# Patient Record
Sex: Female | Born: 1965
Health system: Southern US, Community
[De-identification: ages and names within clinical notes are randomized; demographics above are authoritative.]

## PROBLEM LIST (undated history)

## (undated) DIAGNOSIS — R011 Cardiac murmur, unspecified: Secondary | ICD-10-CM

## (undated) DIAGNOSIS — K219 Gastro-esophageal reflux disease without esophagitis: Secondary | ICD-10-CM

## (undated) DIAGNOSIS — Z923 Personal history of irradiation: Secondary | ICD-10-CM

## (undated) DIAGNOSIS — N390 Urinary tract infection, site not specified: Secondary | ICD-10-CM

## (undated) DIAGNOSIS — F419 Anxiety disorder, unspecified: Secondary | ICD-10-CM

## (undated) DIAGNOSIS — T7840XA Allergy, unspecified, initial encounter: Secondary | ICD-10-CM

## (undated) HISTORY — DX: Gastro-esophageal reflux disease without esophagitis: K21.9

## (undated) HISTORY — PX: CHOLECYSTECTOMY: SHX55

## (undated) HISTORY — PX: DILATION AND CURETTAGE OF UTERUS: SHX78

## (undated) HISTORY — DX: Cardiac murmur, unspecified: R01.1

## (undated) HISTORY — PX: WISDOM TOOTH EXTRACTION: SHX21

## (undated) HISTORY — DX: Allergy, unspecified, initial encounter: T78.40XA

## (undated) HISTORY — DX: Urinary tract infection, site not specified: N39.0

---
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
## 1998-04-03 ENCOUNTER — Ambulatory Visit (HOSPITAL_COMMUNITY): Admission: RE | Admit: 1998-04-03 | Discharge: 1998-04-03 | Payer: Self-pay | Admitting: Obstetrics and Gynecology

## 1998-06-14 ENCOUNTER — Inpatient Hospital Stay (HOSPITAL_COMMUNITY): Admission: AD | Admit: 1998-06-14 | Discharge: 1998-06-14 | Payer: Self-pay | Admitting: Obstetrics and Gynecology

## 1998-06-16 ENCOUNTER — Encounter (HOSPITAL_COMMUNITY): Admission: RE | Admit: 1998-06-16 | Discharge: 1998-06-29 | Payer: Self-pay | Admitting: Obstetrics and Gynecology

## 1998-06-28 ENCOUNTER — Inpatient Hospital Stay (HOSPITAL_COMMUNITY): Admission: AD | Admit: 1998-06-28 | Discharge: 1998-07-01 | Payer: Self-pay | Admitting: Obstetrics and Gynecology

## 1999-12-19 ENCOUNTER — Other Ambulatory Visit: Admission: RE | Admit: 1999-12-19 | Discharge: 1999-12-19 | Payer: Self-pay | Admitting: Obstetrics and Gynecology

## 2001-01-22 ENCOUNTER — Ambulatory Visit (HOSPITAL_BASED_OUTPATIENT_CLINIC_OR_DEPARTMENT_OTHER): Admission: RE | Admit: 2001-01-22 | Discharge: 2001-01-22 | Payer: Self-pay | Admitting: Plastic Surgery

## 2001-01-22 ENCOUNTER — Encounter (INDEPENDENT_AMBULATORY_CARE_PROVIDER_SITE_OTHER): Payer: Self-pay | Admitting: Specialist

## 2001-02-09 ENCOUNTER — Other Ambulatory Visit: Admission: RE | Admit: 2001-02-09 | Discharge: 2001-02-09 | Payer: Self-pay | Admitting: Obstetrics and Gynecology

## 2002-02-10 ENCOUNTER — Other Ambulatory Visit: Admission: RE | Admit: 2002-02-10 | Discharge: 2002-02-10 | Payer: Self-pay | Admitting: Obstetrics and Gynecology

## 2003-06-27 ENCOUNTER — Encounter: Payer: Self-pay | Admitting: Obstetrics and Gynecology

## 2003-06-27 ENCOUNTER — Ambulatory Visit (HOSPITAL_COMMUNITY): Admission: RE | Admit: 2003-06-27 | Discharge: 2003-06-27 | Payer: Self-pay | Admitting: Obstetrics and Gynecology

## 2003-06-29 ENCOUNTER — Ambulatory Visit (HOSPITAL_COMMUNITY): Admission: RE | Admit: 2003-06-29 | Discharge: 2003-06-29 | Payer: Self-pay | Admitting: Obstetrics and Gynecology

## 2003-06-29 ENCOUNTER — Encounter (INDEPENDENT_AMBULATORY_CARE_PROVIDER_SITE_OTHER): Payer: Self-pay | Admitting: Specialist

## 2003-08-20 ENCOUNTER — Inpatient Hospital Stay (HOSPITAL_COMMUNITY): Admission: AD | Admit: 2003-08-20 | Discharge: 2003-08-20 | Payer: Self-pay | Admitting: Obstetrics and Gynecology

## 2004-01-02 ENCOUNTER — Encounter: Admission: RE | Admit: 2004-01-02 | Discharge: 2004-01-02 | Payer: Self-pay | Admitting: Obstetrics and Gynecology

## 2006-06-30 ENCOUNTER — Encounter: Admission: RE | Admit: 2006-06-30 | Discharge: 2006-06-30 | Payer: Self-pay | Admitting: Obstetrics and Gynecology

## 2007-07-13 ENCOUNTER — Encounter: Admission: RE | Admit: 2007-07-13 | Discharge: 2007-07-13 | Payer: Self-pay | Admitting: Surgery

## 2007-09-07 ENCOUNTER — Encounter: Admission: RE | Admit: 2007-09-07 | Discharge: 2007-09-07 | Payer: Self-pay | Admitting: Obstetrics and Gynecology

## 2008-06-02 ENCOUNTER — Ambulatory Visit: Payer: Self-pay | Admitting: Cardiovascular Disease

## 2008-07-01 ENCOUNTER — Encounter: Payer: Self-pay | Admitting: Cardiovascular Disease

## 2008-07-01 ENCOUNTER — Ambulatory Visit: Payer: Self-pay

## 2008-07-28 DIAGNOSIS — Z8679 Personal history of other diseases of the circulatory system: Secondary | ICD-10-CM

## 2008-07-28 DIAGNOSIS — K219 Gastro-esophageal reflux disease without esophagitis: Secondary | ICD-10-CM | POA: Insufficient documentation

## 2008-08-01 ENCOUNTER — Ambulatory Visit: Payer: Self-pay | Admitting: Gastroenterology

## 2008-08-01 DIAGNOSIS — K8020 Calculus of gallbladder without cholecystitis without obstruction: Secondary | ICD-10-CM | POA: Insufficient documentation

## 2008-09-26 ENCOUNTER — Encounter: Admission: RE | Admit: 2008-09-26 | Discharge: 2008-09-26 | Payer: Self-pay | Admitting: Obstetrics and Gynecology

## 2009-01-18 ENCOUNTER — Encounter (INDEPENDENT_AMBULATORY_CARE_PROVIDER_SITE_OTHER): Payer: Self-pay | Admitting: *Deleted

## 2009-10-16 ENCOUNTER — Encounter: Admission: RE | Admit: 2009-10-16 | Discharge: 2009-10-16 | Payer: Self-pay | Admitting: Obstetrics and Gynecology

## 2010-10-13 ENCOUNTER — Other Ambulatory Visit: Payer: Self-pay | Admitting: Family Medicine

## 2010-10-13 DIAGNOSIS — Z9289 Personal history of other medical treatment: Secondary | ICD-10-CM

## 2010-10-29 ENCOUNTER — Ambulatory Visit (HOSPITAL_COMMUNITY): Payer: Self-pay

## 2010-10-29 ENCOUNTER — Ambulatory Visit
Admission: RE | Admit: 2010-10-29 | Discharge: 2010-10-29 | Disposition: A | Discharge status: Home / Self Care | Payer: BC Managed Care – PPO | Source: Ambulatory Visit | Attending: Family Medicine | Admitting: Family Medicine

## 2010-10-29 DIAGNOSIS — Z9289 Personal history of other medical treatment: Secondary | ICD-10-CM

## 2010-11-01 ENCOUNTER — Other Ambulatory Visit: Payer: Self-pay | Admitting: Obstetrics and Gynecology

## 2010-11-01 DIAGNOSIS — Z9289 Personal history of other medical treatment: Secondary | ICD-10-CM

## 2011-02-05 NOTE — Assessment & Plan Note (Signed)
[facility]                            CARDIOLOGY OFFICE NOTE   NAME:[name], [name]                      MRN:          [id number]  DATE:06/02/2008                            DOB:          [birth year]    A 45 year old patient referred for some dyspnea and atypical chest pain.  The patient's coronary risk factors are limited.  She is nondiabetic,  nonsmoker, nonhypertensive, family history is negative.   She has been having pain for about a month.  The pain does have a GI-  type component.  She gets a pressure sensation in the middle of her  chest, sometimes it is relieved with drinking a coke.  She also gets  some relief with burping.  She has been on Nexium for about 2 weeks.  She says it helps some, but the pain is still there.  She does  occasionally get it with walking.  She is in 2 exercise classes and  indicates that she can get some left-sided pain during her exercise  classes.  There is some mild exertional dyspnea as well.  There is no  cough, PND, or orthopnea.  No volume overload.  No history of smoking or  COPD.  The pain is seems to be improving some, but it is persistent,  there is no radiation down the arms.   Review of systems otherwise negative.   Her past medical history is fairly benign.  She has had a previous  cholecystectomy.   The patient does not smoke or drink.  She is happily married.  She has  one 39-year-old son.  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   Family history is negative for premature coronary disease.  Mother and  father still alive.  She is taking Nexium, has no known allergies.   PHYSICAL EXAMINATION:  GENERAL:  Exam is remarkable for thin white  female in no distress.  VITAL SIGNS:  Blood pressure is 128/80, pulse 70 regular, respiratory  rate 14, afebrile.  HEENT:  Unremarkable.  NECK:  Carotids normal without bruit, no lymphadenopathy, thyromegaly,  or JVP elevation.  LUNGS:  Clear.   Good diaphragmatic motion.  No wheezing.  S1 and S2 with  normal heart sounds.  PMI normal.  ABDOMEN:  Benign.  Bowel sounds positive.  No AAA no tenderness, no  bruit, no hepatosplenomegaly, hepatojugular reflux, and tenderness.  EXTREMITIES:  Distal pulses were intact.  No edema.  NEURO:  Nonfocal.  SKIN:  Warm and dry.  No muscular weakness.   EKG shows sinus rhythm with nonspecific ST-T wave changes of an  incomplete right bundle branch block.   IMPRESSION:  1. Atypical chest pain, minor abnormalities on EKG.  Follow-up stress      echo.  2. Likely a reflux.  Continue Nexium.  Consider switching to Protonix      if symptoms continue would refer her to GI for Helicobacter pylori      testing.  3. Mild exertional dyspnea likely functional.  Continue to do exercise      classes, we will do a  baseline chest x-ray today given her chest      pain and mild dyspnea, I think it is important to just make sure      her cardiac silhouette is normal.  There is no mediastinal masses      and that her lung fields are clear.     Noralyn Pick. Eden Emms, MD, [facility]  Electronically Signed    PCN/MedQ  DD: 06/02/2008  DT: 06/03/2008  Job #: [id number]   cc:   Bennie Pierini, FNP

## 2011-02-08 NOTE — Op Note (Signed)
[facility]  Patient:    [name], [name]                      MRN: [id number] Proc. Date: 01/22/01 Adm. Date:  [id number] Attending:  Chapman Moss                           Operative Report  PREOPERATIVE DIAGNOSIS:  Basal cell carcinoma of the upper lip which touches the vermilion border and is at least 5 mm in diameter.  POSTOPERATIVE DIAGNOSIS: 1. Basal cell carcinoma of the upper lip which touches the vermilion border    and is at least 5 mm in diameter. 2. Complex open wound of the lip greater than 1.5 cm.  OPERATION PERFORMED: 1. Excision of basal cell carcinoma, upper lip 5 mm. 2. Complex wound closure resulting from the excision of this basal cell    carcinoma, greater than 1.5 cm.  SURGEON:  Teena Irani. Odis Luster, M.D.  ANESTHESIA:  1% Xylocaine with epinephrine plus bicarb.  INDICATIONS FOR PROCEDURE:  The patient is a 45 year old woman with a lesion of the upper lip, biopsy proven basal cell carcinoma by dermatologist.  This lesion touches the vermilion border and originated on the vermilion of the lip.  It is at the base of the left philtral column and extends over towards the midline in the cupids bow region.  The nature of the procedure and the risks were discussed with her.  She understood the possibility of further surgery if negative margins were not obtained on the final pathology specimen. She also understood that there would be scarring, potential for wound healing problems and a great deal of bruising and swelling initially.  She wished to proceed.  She understood that the excision would need to cross the vermilion border in order to accomplish the excision.  DESCRIPTION OF PROCEDURE:  The patient was placed supine.  She was prepped with Betadine and draped with sterile drapes.  Under loupe magnification, the elliptical excision was marked extending up over the vermilion border.  The vermilion border was marked on  either side of the planned excision in order to permit accurate approximation post excision.  Satisfactory local anesthesia was achieved and the excision was performed.  The wound was irrigated and the closure in layers of 5-0 chromic interrupted inverted deep sutures, 5-0 chromic interrupted inverted subcutaneous sutures and 6-0 Prolene simple interrupted sutures approximating the vermilion border first prior to completing the closures.  The patient tolerated the procedure well.  DISPOSITION:  Use ice today, tonight and tomorrow.  Antibiotic ointment to the wound a couple of times a day.  Darvocet-N 100 a total of 10 given 1 p.o. q.6h. p.r.n. pain.  Recheck next week.  Limit activities for the next four days. DD:  01/22/01 TD:  01/23/01 Job: [id number]

## 2011-02-08 NOTE — Op Note (Signed)
   NAME:  [name], [name]                         ACCOUNT NO.:  [id number]   MEDICAL RECORD NO.:  [id number]                   PATIENT TYPE:  AMB   LOCATION:  DAY                                  FACILITY:  [facility]   PHYSICIAN:  Malachi Pro. Ambrose Mantle, M.D.              DATE OF BIRTH:  August 21, 1966   DATE OF PROCEDURE:  06/29/2003  DATE OF DISCHARGE:                                 OPERATIVE REPORT   PREOPERATIVE DIAGNOSIS:  Nonviable intrauterine pregnancy at nine weeks.   POSTOPERATIVE DIAGNOSIS:  Nonviable intrauterine pregnancy at nine weeks.   OPERATION:  Suction D&C   OPERATOR:  Malachi Pro. Ambrose Mantle, M.D.   ANESTHESIA:  General.   DESCRIPTION OF PROCEDURE:  The patient was brought to the operating room.  Her blood type was O positive.  She had been found to have Neal nonviable  intrauterine pregnancy on three consecutive ultrasounds by three different  examiners, one by me and two by ultrasonographers in the hospital.  There  was Neal suggestion of Neal cystic hygroma.  The patient was placed under  satisfactory general anesthesia.  She was placed in the dorsal lithotomy  position.  Exam revealed the uterus to be posterior, probably 8-9 weeks  size.  Neal large left ovarian cyst was present which was seen on ultrasound.  The vulva, vagina, perineum, and urethra were prepped with Betadine  solution.  The area was draped as Neal sterile field.  The cervix was grasped  with Neal tenaculum and drawn into the operative field.  The uterus sounded to  10 cm posteriorly.  The cervical canal was then dilated to Neal 31 Pratt  dilator.  Neal #9 curved suction curette was placed into the cavity, and Neal  suction D&C was done.  Some of the material was removed by grasping it with  the uterine dressing forceps.  After all of the tissue had apparently been  removed, I used Neal sharp curette to see if the walls felt smooth, and they  did.  I then completed the procedure by Neal couple of more circuits with the  suction curette.  The  procedure was then terminated.  I inspected the  products of conception.  It looked like we had probably 20-30 g of tissue,  and the tissue was preserved for pathological exam to include chromosomes  since there was Neal question of Neal cystic hygroma on the ultrasound.  The  patient was returned to recovery in satisfactory condition.  Blood loss was  about 50 mL.                                               Malachi Pro. Ambrose Mantle, M.D.    TFH/MEDQ  D:  06/29/2003  T:  06/29/2003  Job:  [id number]

## 2011-10-28 ENCOUNTER — Other Ambulatory Visit: Payer: Self-pay | Admitting: Obstetrics and Gynecology

## 2011-10-28 DIAGNOSIS — Z1231 Encounter for screening mammogram for malignant neoplasm of breast: Secondary | ICD-10-CM

## 2011-11-25 ENCOUNTER — Ambulatory Visit
Admission: RE | Admit: 2011-11-25 | Discharge: 2011-11-25 | Disposition: A | Discharge status: Home / Self Care | Payer: BC Managed Care – PPO | Source: Ambulatory Visit | Attending: Obstetrics and Gynecology | Admitting: Obstetrics and Gynecology

## 2011-11-25 DIAGNOSIS — Z1231 Encounter for screening mammogram for malignant neoplasm of breast: Secondary | ICD-10-CM

## 2012-05-29 ENCOUNTER — Encounter: Payer: Self-pay | Admitting: Gastroenterology

## 2012-06-25 ENCOUNTER — Ambulatory Visit (INDEPENDENT_AMBULATORY_CARE_PROVIDER_SITE_OTHER): Payer: BC Managed Care – PPO | Admitting: Gastroenterology

## 2012-06-25 ENCOUNTER — Encounter: Payer: Self-pay | Admitting: Gastroenterology

## 2012-06-25 VITALS — BP 110/70 | HR 88 | Ht 67.0 in | Wt 140.6 lb

## 2012-06-25 DIAGNOSIS — Z8679 Personal history of other diseases of the circulatory system: Secondary | ICD-10-CM

## 2012-06-25 DIAGNOSIS — R194 Change in bowel habit: Secondary | ICD-10-CM

## 2012-06-25 DIAGNOSIS — R198 Other specified symptoms and signs involving the digestive system and abdomen: Secondary | ICD-10-CM

## 2012-06-25 MED ORDER — HYOSCYAMINE SULFATE 0.125 MG SL SUBL: 0.2500 mg | SUBLINGUAL_TABLET | SUBLINGUAL | Status: DC | PRN

## 2012-06-25 NOTE — Patient Instructions (Addendum)
You have been scheduled for a colonoscopy with propofol. Please follow written instructions given to you at your visit today.  If you use inhalers (even only as needed), please bring them with you on the day of your procedure. We have given you the Suprep kit

## 2012-06-25 NOTE — Assessment & Plan Note (Signed)
I do not appreciate a significant murmur

## 2012-06-25 NOTE — Assessment & Plan Note (Addendum)
6-8 month history of noticable change of bowel habits with accompanying symptoms of pain and distention . A structural abnormality of the colon should be ruled out.  Recommendations #1 colonoscopy #2 hyomax when necessary

## 2012-06-25 NOTE — Progress Notes (Signed)
History of Present Illness: Pleasant 46 year old white female referred at the request of Dr. Christell Constant for evaluation of change in bowel habits. For the past 6 months she's noticed a noticeable change consistent of very erratic bowels characterized by episodes of constipation with distention gas and crampy lower abdominal pain, often followed by loose stools. There has been no change in diet or medications. She denies rectal bleeding.  No specific foods cause the symptoms    Past Medical History  Diagnosis Date  . Esophageal reflux   . Heart murmur    Past Surgical History  Procedure Date  . Cholecystectomy     2008   family history includes Diabetes in her maternal grandmother. Current Outpatient Prescriptions  Medication Sig Dispense Refill  . esomeprazole (NEXIUM) 40 MG capsule Take 40 mg by mouth daily before breakfast.       Allergies as of 06/25/2012  . (No Known Allergies)    reports that she has never smoked. She has never used smokeless tobacco. She reports that she drinks alcohol. She reports that she does not use illicit drugs.     Review of Systems: Pertinent positive and negative review of systems were noted in the above HPI section. All other review of systems were otherwise negative.  Vital signs were reviewed in today's medical record Physical Exam: General: Well developed , well nourished, no acute distress Head: Normocephalic and atraumatic Eyes:  sclerae anicteric, EOMI Ears: Normal auditory acuity Mouth: No deformity or lesions Neck: Supple, no masses or thyromegaly Lungs: Clear throughout to auscultation Heart: Regular rate and rhythm; no murmurs, rubs or bruits Abdomen: Soft, non tender and non distended. No masses, hepatosplenomegaly or hernias noted. Normal Bowel sounds Rectal:deferred Musculoskeletal: Symmetrical with no gross deformities  Skin: No lesions on visible extremities Pulses:  Normal pulses noted Extremities: No clubbing, cyanosis, edema or  deformities noted Neurological: Alert oriented x 4, grossly nonfocal Cervical Nodes:  No significant cervical adenopathy Inguinal Nodes: No significant inguinal adenopathy Psychological:  Alert and cooperative. Normal mood and affect

## 2012-07-20 ENCOUNTER — Encounter: Payer: BC Managed Care – PPO | Admitting: Gastroenterology

## 2013-01-05 ENCOUNTER — Other Ambulatory Visit: Payer: Self-pay

## 2013-01-05 DIAGNOSIS — Z1231 Encounter for screening mammogram for malignant neoplasm of breast: Secondary | ICD-10-CM

## 2013-02-22 ENCOUNTER — Ambulatory Visit: Payer: BC Managed Care – PPO

## 2013-04-02 ENCOUNTER — Ambulatory Visit (INDEPENDENT_AMBULATORY_CARE_PROVIDER_SITE_OTHER): Payer: BC Managed Care – PPO | Admitting: Physician Assistant

## 2013-04-02 ENCOUNTER — Encounter: Payer: Self-pay | Admitting: Physician Assistant

## 2013-04-02 VITALS — BP 133/87 | HR 62 | Temp 97.3°F | Ht 67.0 in | Wt 137.0 lb

## 2013-04-02 DIAGNOSIS — L709 Acne, unspecified: Secondary | ICD-10-CM

## 2013-04-02 DIAGNOSIS — L708 Other acne: Secondary | ICD-10-CM

## 2013-04-02 DIAGNOSIS — F419 Anxiety disorder, unspecified: Secondary | ICD-10-CM

## 2013-04-02 DIAGNOSIS — F489 Nonpsychotic mental disorder, unspecified: Secondary | ICD-10-CM

## 2013-04-02 DIAGNOSIS — F411 Generalized anxiety disorder: Secondary | ICD-10-CM

## 2013-04-02 DIAGNOSIS — F5105 Insomnia due to other mental disorder: Secondary | ICD-10-CM

## 2013-04-02 MED ORDER — DIAZEPAM 5 MG PO TABS
5.0000 mg | ORAL_TABLET | Freq: Two times a day (BID) | ORAL | Status: DC | PRN
Start: 2013-04-02 — End: 2013-04-02

## 2013-04-02 MED ORDER — TRETINOIN 0.05 % EX CREA: TOPICAL_CREAM | Freq: Every day | CUTANEOUS | Status: DC

## 2013-04-02 MED ORDER — DOXYCYCLINE HYCLATE 100 MG PO TABS
100.0000 mg | ORAL_TABLET | Freq: Every day | ORAL | Status: DC
Start: 2013-04-02 — End: 2013-04-02

## 2013-04-02 MED ORDER — DIAZEPAM 5 MG PO TABS: 5.0000 mg | ORAL_TABLET | Freq: Two times a day (BID) | ORAL | Status: DC | PRN

## 2013-04-02 MED ORDER — DOXYCYCLINE HYCLATE 100 MG PO TABS: 100.0000 mg | ORAL_TABLET | Freq: Every day | ORAL | Status: DC

## 2013-04-02 NOTE — Progress Notes (Signed)
  Subjective:    Patient ID: [name], female    DOB: 1966-04-29, 47 y.o.   MRN: [id number]  HPI 47 y/o female presents for c/o insomnia x 6+ months. She occasionally  works late hours and feels that the anxiety from working these hours may be contributing to her inability to relax and related insomnia. She has tried Melatonin with no relief and Ambien with unwanted SE's for sleep. She also has had flares of acne breakouts over the past two months on back and face.     Review of Systems     Objective:   Physical Exam AOx3.VS WNL.  Appropriate effect and mood. Pleasant and cooperative. PERRLA.  HR RRR. No murmurs, rubs, gallops. Respiratory Rate WNL. No wheezes, rales, crackles. Few open comedomes on face, primarily on back.         Assessment & Plan:  At this time, I feel that patient should try a low dose of Diazepam 5mg  taken up to twice daily for her anxiety and sleep. I explained the possible habit forming of this medication and patient understands and agrees that she will comply to take it only as prescribed and no more than twice daily, if that much. If this does not work, we discussed the possibility of a daily SSRI or SNRI for prevention of anxiety. I also prescribed Doxycycline 100 mg daily and Retin A for acne. Patient will f/u and RTC if symptoms persist or worsen.

## 2013-05-03 ENCOUNTER — Ambulatory Visit
Admission: RE | Admit: 2013-05-03 | Discharge: 2013-05-03 | Disposition: A | Discharge status: Home / Self Care | Payer: BC Managed Care – PPO | Source: Ambulatory Visit

## 2013-05-03 DIAGNOSIS — Z1231 Encounter for screening mammogram for malignant neoplasm of breast: Secondary | ICD-10-CM

## 2013-06-26 ENCOUNTER — Ambulatory Visit (INDEPENDENT_AMBULATORY_CARE_PROVIDER_SITE_OTHER): Payer: BC Managed Care – PPO | Admitting: Family Medicine

## 2013-06-26 VITALS — BP 129/79 | HR 64 | Temp 98.1°F | Ht 68.0 in | Wt 141.2 lb

## 2013-06-26 DIAGNOSIS — R300 Dysuria: Secondary | ICD-10-CM

## 2013-06-26 DIAGNOSIS — N390 Urinary tract infection, site not specified: Secondary | ICD-10-CM

## 2013-06-26 LAB — POCT URINALYSIS DIPSTICK
Bilirubin, UA: NEGATIVE
Glucose, UA: NEGATIVE
Ketones, UA: NEGATIVE
Nitrite, UA: NEGATIVE
Protein, UA: NEGATIVE
Spec Grav, UA: 1.01
Urobilinogen, UA: 0.2
pH, UA: 7

## 2013-06-26 MED ORDER — CIPROFLOXACIN HCL 500 MG PO TABS: 500.0000 mg | ORAL_TABLET | Freq: Two times a day (BID) | ORAL | Status: DC

## 2013-06-26 NOTE — Progress Notes (Signed)
Patient ID: [name], female   DOB: August 06, 1966, 47 y.o.   MRN: [id number] SUBJECTIVE: CC: Chief Complaint  Patient presents with  . Acute Visit    uti symptoms     HPI:  Urinary Tract Infection Patient complains of burning with urination and hematuria. She has had symptoms for 4 days. Patient also complains of fever. Patient denies congestion, cough, rhinitis, sorethroat, stomach ache and vaginal discharge. Patient does not have a history of recurrent UTI. Patient does not have a history of pyelonephritis.   Married  No STI.  Past Medical History  Diagnosis Date  . Esophageal reflux   . Heart murmur    Past Surgical History  Procedure Laterality Date  . Cholecystectomy      2008   History   Social History  . Marital Status: Married    Spouse Name: N/A    Number of Children: N/A  . Years of Education: N/A   Occupational History  . Not on file.   Social History Main Topics  . Smoking status: Never Smoker   . Smokeless tobacco: Never Used  . Alcohol Use: Yes     Comment: occas  . Drug Use: No  . Sexual Activity: Not on file   Other Topics Concern  . Not on file   Social History Narrative  . No narrative on file   Family History  Problem Relation Age of Onset  . Diabetes Maternal Grandmother    Current Outpatient Prescriptions on File Prior to Visit  Medication Sig Dispense Refill  . diazepam (VALIUM) 5 MG tablet Take 1 tablet (5 mg total) by mouth every 12 (twelve) hours as needed for anxiety.  60 tablet  2  . esomeprazole (NEXIUM) 40 MG capsule Take 40 mg by mouth daily before breakfast.      . hyoscyamine (LEVSIN SL) 0.125 MG SL tablet Place 2 tablets (0.25 mg total) under the tongue every 4 (four) hours as needed for cramping.  30 tablet  0  . tretinoin (RETIN-A) 0.05 % cream Apply topically at bedtime. Apply BB size amt at bedtime over entire face.  45 g  2   No current facility-administered medications on file prior to visit.   No Known  Allergies  There is no immunization history on file for this patient. Prior to Admission medications   Medication Sig Start Date End Date Taking? Authorizing Provider  diazepam (VALIUM) 5 MG tablet Take 1 tablet (5 mg total) by mouth every 12 (twelve) hours as needed for anxiety. 04/02/13  Yes Tiffany Gann, PA-C  esomeprazole (NEXIUM) 40 MG capsule Take 40 mg by mouth daily before breakfast.   Yes Historical Provider, MD  hyoscyamine (LEVSIN SL) 0.125 MG SL tablet Place 2 tablets (0.25 mg total) under the tongue every 4 (four) hours as needed for cramping. 06/25/12  Yes Louis Meckel, MD  tretinoin (RETIN-A) 0.05 % cream Apply topically at bedtime. Apply BB size amt at bedtime over entire face. 04/02/13  Yes Tiffany Gann, PA-C    ROS: As above in the HPI. All other systems are stable or negative.  OBJECTIVE: APPEARANCE:  Patient in no acute distress.The patient appeared well nourished and normally developed. Acyanotic. Waist: VITAL SIGNS:BP 129/79  Pulse 64  Temp(Src) 98.1 F (36.7 C) (Oral)  Ht 5\' 8"  (1.727 m)  Wt 141 lb 3.2 oz (64.048 kg)  BMI 21.47 kg/m2  LMP 02/24/2013 WF looks well  SKIN: warm and  Dry without overt rashes, tattoos and scars  HEAD and Neck: without JVD, Head and scalp: normal Eyes:No scleral icterus. Fundi normal, eye movements normal. Ears: Auricle normal, canal normal, Tympanic membranes normal, insufflation normal. Nose: normal Throat: normal Neck & thyroid: normal  CHEST & LUNGS: Chest wall: normal Lungs: Clear  CVS: Reveals the PMI to be normally located. Regular rhythm, First and Second Heart sounds are normal,  absence of murmurs, rubs or gallops. Peripheral vasculature: Radial pulses: normal Dorsal pedis pulses: normal Posterior pulses: normal  ABDOMEN:  Appearance: normal Benign, no organomegaly, no masses, no Abdominal Aortic enlargement. No Guarding , no rebound. No Bruits. Supra pubic tenderness. Bowel sounds: normal  RECTAL:  N/A GU: N/A  EXTREMETIES: nonedematous.  NEUROLOGIC: oriented to time,place and person; nonfocal. Results for orders placed in visit on 06/26/13  POCT URINALYSIS DIPSTICK      Result Value Range   Color, UA yel     Clarity, UA cloudy     Glucose, UA neg     Bilirubin, UA neg     Ketones, UA neg     Spec Grav, UA 1.010     Blood, UA trace     pH, UA 7.0     Protein, UA neg     Urobilinogen, UA 0.2     Nitrite, UA neg     Leukocytes, UA moderate (2+)      ASSESSMENT: Dysuria - Plan: Urinalysis Dipstick  UTI (urinary tract infection)  PLAN: Orders Placed This Encounter  Procedures  . Urinalysis Dipstick    Meds ordered this encounter  Medications  . ciprofloxacin (CIPRO) 500 MG tablet    Sig: Take 1 tablet (500 mg total) by mouth 2 (two) times daily.    Dispense:  10 tablet    Refill:  0    Fluids, cranberry juice , post coital voiding.  Return if symptoms worsen or fail to improve.  Kaleigh Spiegelman P. Modesto Charon, M.D.

## 2013-07-29 ENCOUNTER — Other Ambulatory Visit: Payer: Self-pay

## 2013-10-11 ENCOUNTER — Telehealth: Payer: Self-pay | Admitting: *Deleted

## 2013-10-11 NOTE — Telephone Encounter (Signed)
[facility] sent fax requesting refill on meclizine 25mg  but do not see on med list. Please advise

## 2013-10-11 NOTE — Telephone Encounter (Signed)
NTBS.

## 2013-11-03 ENCOUNTER — Ambulatory Visit: Payer: BC Managed Care – PPO | Admitting: Family Medicine

## 2014-04-20 ENCOUNTER — Other Ambulatory Visit: Payer: Self-pay

## 2014-04-20 DIAGNOSIS — Z1231 Encounter for screening mammogram for malignant neoplasm of breast: Secondary | ICD-10-CM

## 2014-05-16 ENCOUNTER — Ambulatory Visit: Payer: BC Managed Care – PPO

## 2014-05-18 ENCOUNTER — Ambulatory Visit (INDEPENDENT_AMBULATORY_CARE_PROVIDER_SITE_OTHER): Payer: BC Managed Care – PPO | Admitting: Nurse Practitioner

## 2014-05-18 ENCOUNTER — Encounter: Payer: Self-pay | Admitting: Nurse Practitioner

## 2014-05-18 VITALS — BP 133/84 | HR 70 | Temp 97.2°F | Ht 68.0 in | Wt 140.0 lb

## 2014-05-18 DIAGNOSIS — N3001 Acute cystitis with hematuria: Secondary | ICD-10-CM

## 2014-05-18 DIAGNOSIS — R300 Dysuria: Secondary | ICD-10-CM

## 2014-05-18 DIAGNOSIS — N3000 Acute cystitis without hematuria: Secondary | ICD-10-CM

## 2014-05-18 LAB — POCT UA - MICROSCOPIC ONLY
Casts, Ur, LPF, POC: NEGATIVE
Crystals, Ur, HPF, POC: NEGATIVE
MUCUS UA: NEGATIVE
Yeast, UA: NEGATIVE

## 2014-05-18 LAB — POCT URINALYSIS DIPSTICK
Bilirubin, UA: NEGATIVE
GLUCOSE UA: NEGATIVE
Ketones, UA: NEGATIVE
NITRITE UA: NEGATIVE
PH UA: 8
UROBILINOGEN UA: NEGATIVE

## 2014-05-18 MED ORDER — NITROFURANTOIN MONOHYD MACRO 100 MG PO CAPS: 100.0000 mg | ORAL_CAPSULE | Freq: Two times a day (BID) | ORAL | Status: DC

## 2014-05-18 NOTE — Patient Instructions (Signed)
Force fluids AZO over the counter X2 days RTO prn Culture pending

## 2014-05-18 NOTE — Progress Notes (Signed)
   Subjective:    Patient ID: [name], female    DOB: 1965/11/15, 48 y.o.   MRN: [id number]  HPI  Urinary frequency, dysuria, and pressure since yesterday.  Has not been taking any medication for relief.    Review of Systems  Constitutional: Negative.   Respiratory: Negative.   Cardiovascular: Negative.   Genitourinary: Positive for dysuria and urgency.  Skin: Negative.        Objective:   Physical Exam  Constitutional: She is oriented to person, place, and time. She appears well-developed and well-nourished.  Cardiovascular: Normal rate, regular rhythm and normal heart sounds.   Pulmonary/Chest: Effort normal and breath sounds normal.  Abdominal: Soft. There is no tenderness.  Pressure in lower abdominal and pelvic region  Neurological: She is alert and oriented to person, place, and time.  Skin: Skin is warm and dry.  Psychiatric: She has a normal mood and affect. Her behavior is normal. Judgment and thought content normal.   BP 133/84  Pulse 70  Temp(Src) 97.2 F (36.2 C) (Oral)  Ht 5\' 8"  (1.727 m)  Wt 140 lb (63.504 kg)  BMI 21.29 kg/m2  Results for orders placed in visit on 05/18/14  POCT URINALYSIS DIPSTICK      Result Value Ref Range   Color, UA gold     Clarity, UA cloudy     Glucose, UA neg     Bilirubin, UA neg     Ketones, UA neg     Spec Grav, UA <=1.005     Blood, UA large     pH, UA 8.0     Protein, UA 1+     Urobilinogen, UA negative     Nitrite, UA neg     Leukocytes, UA large (3+)    POCT UA - MICROSCOPIC ONLY      Result Value Ref Range   WBC, Ur, HPF, POC tntc     RBC, urine, microscopic 10-20     Bacteria, U Microscopic few     Mucus, UA neg     Epithelial cells, urine per micros few     Crystals, Ur, HPF, POC neg     Casts, Ur, LPF, POC neg     Yeast, UA neg            Assessment & Plan:   1. Dysuria   2. Acute cystitis with hematuria    Meds ordered this encounter  Medications  . nitrofurantoin,  macrocrystal-monohydrate, (MACROBID) 100 MG capsule    Sig: Take 1 capsule (100 mg total) by mouth 2 (two) times daily.    Dispense:  14 capsule    Refill:  0    Order Specific Question:  Supervising Provider    Answer:  Joycelyn Man   Force fluids AZO over the counter X2 days RTO prn Culture pending  Mary-Margaret Hassell Done, FNP

## 2014-05-19 LAB — URINE CULTURE

## 2014-06-06 ENCOUNTER — Ambulatory Visit
Admission: RE | Admit: 2014-06-06 | Discharge: 2014-06-06 | Disposition: A | Discharge status: Home / Self Care | Payer: BC Managed Care – PPO | Source: Ambulatory Visit

## 2014-06-06 DIAGNOSIS — Z1231 Encounter for screening mammogram for malignant neoplasm of breast: Secondary | ICD-10-CM

## 2014-07-08 ENCOUNTER — Other Ambulatory Visit: Payer: Self-pay

## 2015-03-02 ENCOUNTER — Ambulatory Visit (INDEPENDENT_AMBULATORY_CARE_PROVIDER_SITE_OTHER): Payer: BLUE CROSS/BLUE SHIELD | Admitting: Physician Assistant

## 2015-03-02 ENCOUNTER — Encounter: Payer: Self-pay | Admitting: Physician Assistant

## 2015-03-02 VITALS — BP 133/81 | HR 76 | Temp 97.4°F | Ht 68.0 in | Wt 143.0 lb

## 2015-03-02 DIAGNOSIS — B373 Candidiasis of vulva and vagina: Secondary | ICD-10-CM

## 2015-03-02 DIAGNOSIS — F411 Generalized anxiety disorder: Secondary | ICD-10-CM | POA: Diagnosis not present

## 2015-03-02 DIAGNOSIS — N3090 Cystitis, unspecified without hematuria: Secondary | ICD-10-CM | POA: Diagnosis not present

## 2015-03-02 DIAGNOSIS — R300 Dysuria: Secondary | ICD-10-CM | POA: Diagnosis not present

## 2015-03-02 DIAGNOSIS — B3731 Acute candidiasis of vulva and vagina: Secondary | ICD-10-CM

## 2015-03-02 LAB — POCT URINALYSIS DIPSTICK
Bilirubin, UA: NEGATIVE
GLUCOSE UA: NEGATIVE
KETONES UA: NEGATIVE
NITRITE UA: NEGATIVE
PH UA: 6.5
Spec Grav, UA: 1.02
Urobilinogen, UA: NEGATIVE

## 2015-03-02 LAB — POCT UA - MICROSCOPIC ONLY
BACTERIA, U MICROSCOPIC: NEGATIVE
CRYSTALS, UR, HPF, POC: NEGATIVE
Casts, Ur, LPF, POC: NEGATIVE
YEAST UA: NEGATIVE

## 2015-03-02 MED ORDER — FLUCONAZOLE 150 MG PO TABS: 150.0000 mg | ORAL_TABLET | Freq: Once | ORAL | Status: DC

## 2015-03-02 MED ORDER — CIPROFLOXACIN HCL 500 MG PO TABS: 500.0000 mg | ORAL_TABLET | Freq: Two times a day (BID) | ORAL | Status: DC

## 2015-03-02 MED ORDER — ALPRAZOLAM 1 MG PO TABS: ORAL_TABLET | ORAL | Status: DC

## 2015-03-02 NOTE — Progress Notes (Signed)
   Subjective:    Patient ID: [name], female    DOB: 30-Apr-1966, 49 y.o.   MRN: [id number]  HPI 49 y/o female presents with c/o burning with urination, pelvic pressure x 2 days.     Review of Systems  Constitutional: Negative.   Gastrointestinal: Positive for abdominal pain.  Endocrine: Positive for polyuria.  Genitourinary: Positive for dysuria, urgency, frequency and pelvic pain (pressure). Negative for hematuria.  All other systems reviewed and are negative.      Objective:   Physical Exam  Constitutional: She appears well-developed and well-nourished. No distress.  Abdominal: Soft. She exhibits no distension and no mass. There is tenderness (suprapubic). There is no rebound and no guarding.  Skin: She is not diaphoretic.  Psychiatric: She has a normal mood and affect. Her behavior is normal. Judgment and thought content normal.  Nursing note and vitals reviewed.   Results for orders placed or performed in visit on 03/02/15  POCT UA - Microscopic Only  Result Value Ref Range   WBC, Ur, HPF, POC 100-125    RBC, urine, microscopic 10-15    Bacteria, U Microscopic neg    Mucus, UA occ    Epithelial cells, urine per micros occ    Crystals, Ur, HPF, POC neg    Casts, Ur, LPF, POC neg    Yeast, UA neg   POCT urinalysis dipstick  Result Value Ref Range   Color, UA yellow    Clarity, UA cloudy    Glucose, UA neg    Bilirubin, UA neg    Ketones, UA neg    Spec Grav, UA 1.020    Blood, UA large    pH, UA 6.5    Protein, UA 2++    Urobilinogen, UA negative    Nitrite, UA neg    Leukocytes, UA large (3+)          Assessment & Plan:  1. Dysuria  - POCT UA - Microscopic Only - POCT urinalysis dipstick - Urine culture - ciprofloxacin (CIPRO) 500 MG tablet; Take 1 tablet (500 mg total) by mouth 2 (two) times daily.  Dispense: 20 tablet; Refill: 0  2. Cystitis  - ciprofloxacin (CIPRO) 500 MG tablet; Take 1 tablet (500 mg total) by mouth 2 (two) times daily.   Dispense: 20 tablet; Refill: 0  3. Vulvovaginal candidiasis  - fluconazole (DIFLUCAN) 150 MG tablet; Take 1 tablet (150 mg total) by mouth once.  Dispense: 1 tablet; Refill: 0  4. Generalized anxiety disorder - D/C Valium  - ALPRAZolam (XANAX) 1 MG tablet; Take 1/2 - 1 tablet TID prn  Dispense: 90 tablet; Refill: 3   RTC if symptoms do not improve after antibiotic  Crystalmarie Yasin A. Benjamin Stain PA-C

## 2015-03-04 LAB — URINE CULTURE

## 2015-03-22 ENCOUNTER — Other Ambulatory Visit: Payer: Self-pay | Admitting: Physician Assistant

## 2015-03-22 DIAGNOSIS — B373 Candidiasis of vulva and vagina: Secondary | ICD-10-CM

## 2015-03-22 DIAGNOSIS — B3731 Acute candidiasis of vulva and vagina: Secondary | ICD-10-CM

## 2015-03-22 MED ORDER — LEVOFLOXACIN 750 MG PO TABS: 750.0000 mg | ORAL_TABLET | Freq: Every day | ORAL | Status: DC

## 2015-03-22 MED ORDER — FLUCONAZOLE 150 MG PO TABS: 150.0000 mg | ORAL_TABLET | Freq: Once | ORAL | Status: DC

## 2015-07-18 ENCOUNTER — Ambulatory Visit (INDEPENDENT_AMBULATORY_CARE_PROVIDER_SITE_OTHER): Payer: BLUE CROSS/BLUE SHIELD | Admitting: Pediatrics

## 2015-07-18 ENCOUNTER — Encounter: Payer: Self-pay | Admitting: Pediatrics

## 2015-07-18 VITALS — BP 119/80 | HR 84 | Temp 97.5°F | Ht 68.0 in | Wt 141.4 lb

## 2015-07-18 DIAGNOSIS — R399 Unspecified symptoms and signs involving the genitourinary system: Secondary | ICD-10-CM

## 2015-07-18 LAB — POCT URINALYSIS DIPSTICK
BILIRUBIN UA: NEGATIVE
Glucose, UA: NEGATIVE
KETONES UA: NEGATIVE
LEUKOCYTES UA: NEGATIVE
Nitrite, UA: NEGATIVE
Protein, UA: NEGATIVE
Urobilinogen, UA: NEGATIVE
pH, UA: 6

## 2015-07-18 LAB — POCT UA - MICROSCOPIC ONLY
CRYSTALS, UR, HPF, POC: NEGATIVE
Casts, Ur, LPF, POC: NEGATIVE
MUCUS UA: NEGATIVE
WBC, UR, HPF, POC: NEGATIVE
YEAST UA: NEGATIVE

## 2015-07-18 NOTE — Progress Notes (Signed)
    Subjective:    Patient ID: [name], female    DOB: [birth year], 49 y.o.   MRN: [id number]  CC: dysuria  HPI: [name] is a 49 y.o. female presenting on 07/18/2015 for Uti symptoms and Vaginal Discharge  Some dysuria for the past two days Periods not regular anymore, has been 6-7 months since last period Having some hot flashes No bladder spasms No fevers Feels similarly but not as bad as prior UTIs, has had 2 in past 1.5 yrs.  Relevant past medical, surgical, family and social history reviewed and updated as indicated. Interim medical history since our last visit reviewed. Allergies and medications reviewed and updated.   ROS: Per HPI unless specifically indicated above  Past Medical History Patient Active Problem List   Diagnosis Date Noted  . Change in bowel habits 06/25/2012  . ESOPHAGEAL REFLUX 07/28/2008  . HEART MURMUR, HX OF 07/28/2008    Current Outpatient Prescriptions  Medication Sig Dispense Refill  . ALPRAZolam (XANAX) 1 MG tablet Take 1/2 - 1 tablet TID prn 90 tablet 3  . esomeprazole (NEXIUM) 40 MG capsule Take 40 mg by mouth daily before breakfast.    . tretinoin (RETIN-A) 0.05 % cream Apply topically at bedtime. Apply BB size amt at bedtime over entire face. 45 g 2   No current facility-administered medications for this visit.       Objective:    BP 119/80 mmHg  Pulse 84  Temp(Src) 97.5 F (36.4 C) (Oral)  Ht 5\' 8"  (1.727 m)  Wt 141 lb 6.4 oz (64.139 kg)  BMI 21.50 kg/m2  Wt Readings from Last 3 Encounters:  07/18/15 141 lb 6.4 oz (64.139 kg)  03/02/15 143 lb (64.864 kg)  05/18/14 140 lb (63.504 kg)    Gen: NAD, alert, cooperative with exam, NCAT EYES: EOMI, no scleral injection or icterus CV: NRRR, normal S1/S2, no murmur, WWP Resp: CTABL, no wheezes, normal WOB Abd: +BS, soft, NTND. no guarding or organomegaly, no CVA tenderness Neuro: Alert and oriented, strength equal b/l UE and LE, coordination grossly normal MSK:  normal muscle bulk     Assessment & Plan:   [name] was seen today for uti symptoms and vaginal discharge. She already took a diflucan that she had at home. Unlikely to be UTI with negative UA, will send urine culture given symptoms. She does 1-3 RBCs, will need repeat UA when not having symptoms to show neg for RBCs.   Diagnoses and all orders for this visit:  UTI symptoms -     POCT UA - Microscopic Only -     POCT urinalysis dipstick   Follow up plan: As needed  Assunta Found, MD [facility] 07/18/2015, 8:50 AM

## 2015-07-20 LAB — URINE CULTURE: ORGANISM ID, BACTERIA: NO GROWTH

## 2015-09-04 ENCOUNTER — Ambulatory Visit: Payer: BLUE CROSS/BLUE SHIELD

## 2015-10-26 ENCOUNTER — Encounter: Payer: Self-pay | Admitting: Nurse Practitioner

## 2015-10-26 ENCOUNTER — Ambulatory Visit (INDEPENDENT_AMBULATORY_CARE_PROVIDER_SITE_OTHER): Payer: BLUE CROSS/BLUE SHIELD | Admitting: Nurse Practitioner

## 2015-10-26 VITALS — BP 121/76 | HR 74 | Temp 97.4°F | Ht 68.0 in | Wt 140.0 lb

## 2015-10-26 DIAGNOSIS — R300 Dysuria: Secondary | ICD-10-CM | POA: Diagnosis not present

## 2015-10-26 LAB — POCT URINALYSIS DIPSTICK
Bilirubin, UA: NEGATIVE
GLUCOSE UA: NEGATIVE
KETONES UA: NEGATIVE
Nitrite, UA: NEGATIVE
Protein, UA: NEGATIVE
SPEC GRAV UA: 1.025
Urobilinogen, UA: NEGATIVE
pH, UA: 5

## 2015-10-26 LAB — POCT UA - MICROSCOPIC ONLY
CASTS, UR, LPF, POC: NEGATIVE
CRYSTALS, UR, HPF, POC: NEGATIVE
Mucus, UA: NEGATIVE
Yeast, UA: NEGATIVE

## 2015-10-26 MED ORDER — CIPROFLOXACIN HCL 500 MG PO TABS: 500.0000 mg | ORAL_TABLET | Freq: Two times a day (BID) | ORAL | Status: DC

## 2015-10-26 NOTE — Progress Notes (Signed)
  Subjective:    [name] is a 50 y.o. female who complains of burning with urination, dysuria, suprapubic pressure and urgency. She has had symptoms for 2 days. Patient also complains of back pain. Patient denies fever and vaginal discharge. Patient does not have a history of recurrent UTI. Patient does not have a history of pyelonephritis.   The following portions of the patient's history were reviewed and updated as appropriate: allergies, current medications, past family history, past medical history, past social history, past surgical history and problem list.  Review of Systems Pertinent items are noted in HPI.    Objective:    BP 121/76 mmHg  Pulse 74  Temp(Src) 97.4 F (36.3 C) (Oral)  Ht 5\' 8"  (1.727 m)  Wt 140 lb (63.504 kg)  BMI 21.29 kg/m2 Lungs: clear to auscultation bilaterally Heart: regular rate and rhythm, S1, S2 normal, no murmur, click, rub or gallop Abdomen: bowel sounds normal, Mild suprapubic pain on palpation  Laboratory:  Results for orders placed or performed in visit on 10/26/15  POCT UA - Microscopic Only  Result Value Ref Range   WBC, Ur, HPF, POC 20-30    RBC, urine, microscopic 5-8    Bacteria, U Microscopic mod    Mucus, UA neg    Epithelial cells, urine per micros mod    Crystals, Ur, HPF, POC neg    Casts, Ur, LPF, POC neg    Yeast, UA neg   POCT urinalysis dipstick  Result Value Ref Range   Color, UA gold    Clarity, UA clear    Glucose, UA neg    Bilirubin, UA neg    Ketones, UA neg    Spec Grav, UA 1.025    Blood, UA mod    pH, UA 5.0    Protein, UA neg    Urobilinogen, UA negative    Nitrite, UA neg    Leukocytes, UA moderate (2+) (A) Negative     Assessment:    Acute cystitis     Plan:   Take medication as prescribe Cotton underwear Take shower not bath Cranberry juice, yogurt Force fluids AZO over the counter X2 days Culture pending RTO prn  Meds ordered this encounter  Medications  . ciprofloxacin  (CIPRO) 500 MG tablet    Sig: Take 1 tablet (500 mg total) by mouth 2 (two) times daily.    Dispense:  10 tablet    Refill:  0    Order Specific Question:  Supervising Provider    Answer:  Chipper Herb [[id number]]    Camilla, FNP

## 2015-10-26 NOTE — Patient Instructions (Signed)

## 2015-10-28 LAB — URINE CULTURE

## 2015-11-02 ENCOUNTER — Other Ambulatory Visit: Payer: Self-pay | Admitting: Nurse Practitioner

## 2015-11-02 MED ORDER — FLUCONAZOLE 150 MG PO TABS: ORAL_TABLET | ORAL | Status: DC

## 2015-11-02 NOTE — Telephone Encounter (Signed)
rx sent to pharmacy per MMM and pt is aware.

## 2015-11-21 ENCOUNTER — Telehealth: Payer: Self-pay | Admitting: Nurse Practitioner

## 2015-11-21 ENCOUNTER — Other Ambulatory Visit (INDEPENDENT_AMBULATORY_CARE_PROVIDER_SITE_OTHER): Payer: BLUE CROSS/BLUE SHIELD

## 2015-11-21 DIAGNOSIS — R399 Unspecified symptoms and signs involving the genitourinary system: Secondary | ICD-10-CM

## 2015-11-21 LAB — POCT UA - MICROSCOPIC ONLY
Casts, Ur, LPF, POC: NEGATIVE
Crystals, Ur, HPF, POC: NEGATIVE
Yeast, UA: NEGATIVE

## 2015-11-21 LAB — POCT URINALYSIS DIPSTICK
Bilirubin, UA: NEGATIVE
GLUCOSE UA: NEGATIVE
Ketones, UA: NEGATIVE
NITRITE UA: NEGATIVE
PROTEIN UA: NEGATIVE
Spec Grav, UA: >=1.03
UROBILINOGEN UA: NEGATIVE
pH, UA: 5

## 2015-11-21 NOTE — Telephone Encounter (Signed)
Orders placed and pt is aware. °

## 2015-11-21 NOTE — Telephone Encounter (Signed)
She can just leave a urine

## 2015-12-07 ENCOUNTER — Encounter: Payer: Self-pay | Admitting: Family Medicine

## 2015-12-07 ENCOUNTER — Ambulatory Visit (INDEPENDENT_AMBULATORY_CARE_PROVIDER_SITE_OTHER): Payer: BLUE CROSS/BLUE SHIELD | Admitting: Family Medicine

## 2015-12-07 VITALS — BP 109/70 | HR 72 | Temp 97.8°F | Ht 68.0 in | Wt 140.8 lb

## 2015-12-07 DIAGNOSIS — J209 Acute bronchitis, unspecified: Secondary | ICD-10-CM

## 2015-12-07 DIAGNOSIS — R6883 Chills (without fever): Secondary | ICD-10-CM | POA: Diagnosis not present

## 2015-12-07 DIAGNOSIS — R05 Cough: Secondary | ICD-10-CM

## 2015-12-07 DIAGNOSIS — R52 Pain, unspecified: Secondary | ICD-10-CM | POA: Diagnosis not present

## 2015-12-07 DIAGNOSIS — R059 Cough, unspecified: Secondary | ICD-10-CM

## 2015-12-07 LAB — VERITOR FLU A/B WAIVED
Influenza A: NEGATIVE
Influenza B: NEGATIVE

## 2015-12-07 MED ORDER — BENZONATATE 200 MG PO CAPS: 200.0000 mg | ORAL_CAPSULE | Freq: Two times a day (BID) | ORAL | Status: DC | PRN

## 2015-12-07 MED ORDER — AZITHROMYCIN 250 MG PO TABS: ORAL_TABLET | ORAL | Status: DC

## 2015-12-07 NOTE — Addendum Note (Signed)
Addended by: Timmothy Euler on: 12/07/2015 07:09 PM   Modules accepted: Orders

## 2015-12-07 NOTE — Patient Instructions (Signed)
Great to meet you!  Acute Bronchitis Bronchitis is when the airways that extend from the windpipe into the lungs get red, puffy, and painful (inflamed). Bronchitis often causes thick spit (mucus) to develop. This leads to a cough. A cough is the most common symptom of bronchitis. In acute bronchitis, the condition usually begins suddenly and goes away over time (usually in 2 weeks). Smoking, allergies, and asthma can make bronchitis worse. Repeated episodes of bronchitis may cause more lung problems. HOME CARE  Rest.  Drink enough fluids to keep your pee (urine) clear or pale yellow (unless you need to limit fluids as told by your doctor).  Only take over-the-counter or prescription medicines as told by your doctor.  Avoid smoking and secondhand smoke. These can make bronchitis worse. If you are a smoker, think about using nicotine gum or skin patches. Quitting smoking will help your lungs heal faster.  Reduce the chance of getting bronchitis again by:  Washing your hands often.  Avoiding people with cold symptoms.  Trying not to touch your hands to your mouth, nose, or eyes.  Follow up with your doctor as told. GET HELP IF: Your symptoms do not improve after 1 week of treatment. Symptoms include:  Cough.  Fever.  Coughing up thick spit.  Body aches.  Chest congestion.  Chills.  Shortness of breath.  Sore throat. GET HELP RIGHT AWAY IF:   You have an increased fever.  You have chills.  You have severe shortness of breath.  You have bloody thick spit (sputum).  You throw up (vomit) often.  You lose too much body fluid (dehydration).  You have a severe headache.  You faint. MAKE SURE YOU:   Understand these instructions.  Will watch your condition.  Will get help right away if you are not doing well or get worse.   This information is not intended to replace advice given to you by your health care provider. Make sure you discuss any questions you have  with your health care provider.   Document Released: 02/26/2008 Document Revised: 05/12/2013 Document Reviewed: 03/02/2013 Elsevier Interactive Patient Education [employer].

## 2015-12-07 NOTE — Progress Notes (Addendum)
   HPI  Patient presents today here for acute illness.  Patient's explains over the last 3 days she's developed cough, chest tightness, chills and malaise, and body aches over the last 1 day. She's had several sick contacts. Tolerating foods and fluids normally She denies any chest pain or shortness of breath.   PMH: Smoking status noted ROS: Per HPI  Objective: BP 109/70 mmHg  Pulse 72  Temp(Src) 97.8 F (36.6 C) (Oral)  Ht 5\' 8"  (1.727 m)  Wt 140 lb 12.8 oz (63.866 kg)  BMI 21.41 kg/m2 Gen: NAD, alert, cooperative with exam HEENT: NCAT, TMs normal bilaterally, nares clear, oropharynx clear, no facial tenderness to palpation CV: RRR, good S1/S2, no murmur appreciated Resp: CTABL, no wheezes, non-labored Ext: No edema, warm Neuro: Alert and oriented, No gross deficits  Assessment and plan:  # acute bronchitis Considering cough and new onset malaise will go ahead and cover for atypicla pneumonia, although it is likely only acute bronchitis Azithro Tessalon RTC with any concerns or worsening    Orders Placed This Encounter  Procedures  . Veritor Flu A/B Waived    Order Specific Question:  Source    Answer:  nose    Meds ordered this encounter  Medications  . azithromycin (ZITHROMAX) 250 MG tablet    Sig: Take 2 tablets on day 1 and 1 tablet daily after that    Dispense:  6 tablet    Refill:  0  . benzonatate (TESSALON) 200 MG capsule    Sig: Take 1 capsule (200 mg total) by mouth 2 (two) times daily as needed for cough.    Dispense:  20 capsule    Refill:  Sylvester, MD [facility] 12/07/2015, 7:04 PM

## 2016-01-23 DIAGNOSIS — M722 Plantar fascial fibromatosis: Secondary | ICD-10-CM | POA: Diagnosis not present

## 2016-01-23 DIAGNOSIS — M7711 Lateral epicondylitis, right elbow: Secondary | ICD-10-CM | POA: Diagnosis not present

## 2016-01-28 DIAGNOSIS — R399 Unspecified symptoms and signs involving the genitourinary system: Secondary | ICD-10-CM | POA: Diagnosis not present

## 2016-02-28 ENCOUNTER — Ambulatory Visit (INDEPENDENT_AMBULATORY_CARE_PROVIDER_SITE_OTHER): Payer: BLUE CROSS/BLUE SHIELD | Admitting: Family

## 2016-02-28 ENCOUNTER — Encounter: Payer: Self-pay | Admitting: Family

## 2016-02-28 VITALS — BP 134/77 | HR 72 | Temp 97.7°F | Ht 68.0 in | Wt 143.2 lb

## 2016-02-28 DIAGNOSIS — N3001 Acute cystitis with hematuria: Secondary | ICD-10-CM

## 2016-02-28 DIAGNOSIS — F411 Generalized anxiety disorder: Secondary | ICD-10-CM

## 2016-02-28 DIAGNOSIS — R309 Painful micturition, unspecified: Secondary | ICD-10-CM

## 2016-02-28 LAB — URINALYSIS, COMPLETE
BILIRUBIN UA: NEGATIVE
GLUCOSE, UA: NEGATIVE
KETONES UA: NEGATIVE
Nitrite, UA: NEGATIVE
PROTEIN UA: NEGATIVE
Specific Gravity, UA: 1.02 (ref 1.005–1.030)
UUROB: 0.2 mg/dL (ref 0.2–1.0)
pH, UA: 7 (ref 5.0–7.5)

## 2016-02-28 LAB — MICROSCOPIC EXAMINATION: Epithelial Cells (non renal): >10 /hpf — AB (ref 0–10)

## 2016-02-28 MED ORDER — ALPRAZOLAM 1 MG PO TABS: ORAL_TABLET | ORAL | Status: DC

## 2016-02-28 MED ORDER — SULFAMETHOXAZOLE-TRIMETHOPRIM 800-160 MG PO TABS: 1.0000 | ORAL_TABLET | Freq: Two times a day (BID) | ORAL | Status: DC

## 2016-02-28 NOTE — Patient Instructions (Signed)

## 2016-02-28 NOTE — Progress Notes (Signed)
   Subjective:    Patient ID: [name], female    DOB: April 04, 1966, 50 y.o.   MRN: [id number]  Dysuria  This is a new problem. The current episode started in the past 7 days. The problem occurs every urination. The problem has been unchanged. The quality of the pain is described as burning. The pain is at a severity of 5/10. The pain is mild. She is sexually active. Associated symptoms include frequency and urgency. Pertinent negatives include no discharge, flank pain, hematuria, hesitancy, nausea or vomiting. She has tried increased fluids for the symptoms. The treatment provided mild relief. There is no history of kidney stones.      Review of Systems  Gastrointestinal: Negative for nausea and vomiting.  Genitourinary: Positive for dysuria, urgency and frequency. Negative for hesitancy, hematuria and flank pain.  All other systems reviewed and are negative.      Objective:   Physical Exam  Constitutional: She is oriented to person, place, and time. She appears well-developed and well-nourished. No distress.  HENT:  Head: Normocephalic.  Cardiovascular: Normal rate, regular rhythm, normal heart sounds and intact distal pulses.   No murmur heard. Pulmonary/Chest: Effort normal and breath sounds normal. No respiratory distress. She has no wheezes.  Abdominal: Soft. Bowel sounds are normal. She exhibits no distension. There is no tenderness.  Musculoskeletal: Normal range of motion. She exhibits no edema or tenderness.  Negative for CVA tenderness   Neurological: She is alert and oriented to person, place, and time.  Skin: Skin is warm and dry.  Psychiatric: She has a normal mood and affect. Her behavior is normal. Judgment and thought content normal.  Vitals reviewed.   BP 134/77 mmHg  Pulse 72  Temp(Src) 97.7 F (36.5 C) (Oral)  Ht 5\' 8"  (1.727 m)  Wt 143 lb 3.2 oz (64.955 kg)  BMI 21.78 kg/m2  LMP 04/24/2015       Assessment & Plan:  1. Pain passing urine -  Urinalysis, Complete  2. Acute cystitis with hematuria -Force fluids AZO over the counter X2 days RTO prn Culture pending - sulfamethoxazole-trimethoprim (BACTRIM DS) 800-160 MG tablet; Take 1 tablet by mouth 2 (two) times daily.  Dispense: 14 tablet; Refill: 0 - Urine culture  Evelina Dun, FNP

## 2016-02-29 LAB — URINE CULTURE: ORGANISM ID, BACTERIA: NO GROWTH

## 2016-03-18 ENCOUNTER — Encounter: Payer: Self-pay | Admitting: Family Medicine

## 2016-03-18 ENCOUNTER — Ambulatory Visit (INDEPENDENT_AMBULATORY_CARE_PROVIDER_SITE_OTHER): Payer: BLUE CROSS/BLUE SHIELD | Admitting: Family Medicine

## 2016-03-18 VITALS — BP 128/78 | HR 76 | Temp 97.3°F | Ht 68.0 in | Wt 143.6 lb

## 2016-03-18 DIAGNOSIS — N390 Urinary tract infection, site not specified: Secondary | ICD-10-CM | POA: Diagnosis not present

## 2016-03-18 LAB — URINALYSIS
BILIRUBIN UA: NEGATIVE
Glucose, UA: NEGATIVE
Ketones, UA: NEGATIVE
NITRITE UA: NEGATIVE
PH UA: 7 (ref 5.0–7.5)
Protein, UA: NEGATIVE
SPEC GRAV UA: 1.02 (ref 1.005–1.030)
Urobilinogen, Ur: 1 mg/dL (ref 0.2–1.0)

## 2016-03-18 NOTE — Progress Notes (Signed)
   HPI  Patient presents today here for follow-up after her urinary tract infection last month.   Patient explains she has very mild dysuria currently, however symptoms are much improved over last visit.  She completed all antibiotics.  She's tolerating food and fluids normally, denies any abdominal pain, fevers, chills, sweats  PMH: Smoking status noted ROS: Per HPI  Objective: BP 128/78 mmHg  Pulse 76  Temp(Src) 97.3 F (36.3 C) (Oral)  Ht 5\' 8"  (1.727 m)  Wt 143 lb 9.6 oz (65.137 kg)  BMI 21.84 kg/m2  SpO2 9%  LMP 04/24/2015 Gen: NAD, alert, cooperative with exam HEENT: NCAT CV: RRR, good S1/S2, no murmur Resp: CTABL, no wheezes, non-labored Abd: SNTND, BS present, no guarding or organomegaly, no CVA tenderness Ext: No edema, warm Neuro: Alert and oriented, No gross deficits  Assessment and plan:  # UTI, follow-up Likely resolved Mild dysuria, 1+ leukocyte esterase on urinalysis Previous urine culture was negative Sent for additional urine culture, return to clinic with any concerns. She would like to call back for negative cultures well. *  Orders Placed This Encounter  Procedures  . Urine culture    Order Specific Question:  Source    Answer:  bladder  . Urine culture  . Urinalysis     Laroy Apple, MD [facility] 03/18/2016, 7:01 PM

## 2016-03-19 LAB — URINE CULTURE: Organism ID, Bacteria: NO GROWTH

## 2016-04-03 DIAGNOSIS — N3020 Other chronic cystitis without hematuria: Secondary | ICD-10-CM | POA: Diagnosis not present

## 2016-04-03 DIAGNOSIS — R350 Frequency of micturition: Secondary | ICD-10-CM | POA: Diagnosis not present

## 2016-04-22 DIAGNOSIS — Z124 Encounter for screening for malignant neoplasm of cervix: Secondary | ICD-10-CM | POA: Diagnosis not present

## 2016-04-22 DIAGNOSIS — Z01419 Encounter for gynecological examination (general) (routine) without abnormal findings: Secondary | ICD-10-CM | POA: Diagnosis not present

## 2016-04-22 DIAGNOSIS — N941 Unspecified dyspareunia: Secondary | ICD-10-CM | POA: Diagnosis not present

## 2016-04-22 DIAGNOSIS — Z130 Encounter for screening for diseases of the blood and blood-forming organs and certain disorders involving the immune mechanism: Secondary | ICD-10-CM | POA: Diagnosis not present

## 2016-04-22 DIAGNOSIS — Z1389 Encounter for screening for other disorder: Secondary | ICD-10-CM | POA: Diagnosis not present

## 2016-05-06 DIAGNOSIS — R350 Frequency of micturition: Secondary | ICD-10-CM | POA: Diagnosis not present

## 2016-05-06 DIAGNOSIS — N3020 Other chronic cystitis without hematuria: Secondary | ICD-10-CM | POA: Diagnosis not present

## 2016-05-31 DIAGNOSIS — M722 Plantar fascial fibromatosis: Secondary | ICD-10-CM | POA: Diagnosis not present

## 2016-05-31 DIAGNOSIS — M7711 Lateral epicondylitis, right elbow: Secondary | ICD-10-CM | POA: Diagnosis not present

## 2016-07-05 ENCOUNTER — Ambulatory Visit: Payer: BLUE CROSS/BLUE SHIELD | Admitting: Family Medicine

## 2016-07-05 ENCOUNTER — Ambulatory Visit (INDEPENDENT_AMBULATORY_CARE_PROVIDER_SITE_OTHER): Payer: BLUE CROSS/BLUE SHIELD | Admitting: Physician Assistant

## 2016-07-05 ENCOUNTER — Encounter: Payer: Self-pay | Admitting: Physician Assistant

## 2016-07-05 VITALS — BP 138/85 | HR 70 | Temp 98.4°F | Ht 68.0 in | Wt 142.4 lb

## 2016-07-05 DIAGNOSIS — R300 Dysuria: Secondary | ICD-10-CM | POA: Diagnosis not present

## 2016-07-05 DIAGNOSIS — N3001 Acute cystitis with hematuria: Secondary | ICD-10-CM

## 2016-07-05 LAB — URINALYSIS, COMPLETE
Bilirubin, UA: NEGATIVE
Glucose, UA: NEGATIVE
KETONES UA: NEGATIVE
NITRITE UA: NEGATIVE
PH UA: 5 (ref 5.0–7.5)
Protein, UA: NEGATIVE
SPEC GRAV UA: 1.015 (ref 1.005–1.030)
Urobilinogen, Ur: 0.2 mg/dL (ref 0.2–1.0)

## 2016-07-05 LAB — MICROSCOPIC EXAMINATION

## 2016-07-05 MED ORDER — SULFAMETHOXAZOLE-TRIMETHOPRIM 800-160 MG PO TABS: 1.0000 | ORAL_TABLET | Freq: Two times a day (BID) | ORAL | 0 refills | Status: DC

## 2016-07-05 NOTE — Patient Instructions (Signed)

## 2016-07-05 NOTE — Progress Notes (Signed)
BP 138/85   Pulse 70   Temp 98.4 F (36.9 C) (Oral)   Ht 5\' 8"  (1.727 m)   Wt 142 lb 6.4 oz (64.6 kg)   BMI 21.65 kg/m    Subjective:    Patient ID: [name], female    DOB: 1966-08-02, 50 y.o.   MRN: [id number]  HPI: [name] is a 50 y.o. female presenting on 07/05/2016 for Dysuria Patient comes in for acute urinary symptoms. She has a long-standing history of recurrent urinary tract infections. She does see Dr. Worthy Flank at [facility]. Through all of the studies no abnormality was found. However she is on nitrofurantoin 100 mg 1 daily as prevention. She has been on it several months. This is the first infection that she has been on. The plan was for her to take it for about a year.  Relevant past medical, surgical, family and social history reviewed and updated as indicated. Interim medical history since our last visit reviewed. Allergies and medications reviewed and updated. DATA REVIEWED: CHART IN EPIC  Social History   Social History  . Marital status: Married    Spouse name: N/A  . Number of children: N/A  . Years of education: N/A   Occupational History  . Not on file.   Social History Main Topics  . Smoking status: Never Smoker  . Smokeless tobacco: Never Used  . Alcohol use Yes     Comment: occas  . Drug use: No  . Sexual activity: Yes   Other Topics Concern  . Not on file   Social History Narrative  . No narrative on file    Past Surgical History:  Procedure Laterality Date  . CHOLECYSTECTOMY     2008    Family History  Problem Relation Age of Onset  . Diabetes Maternal Grandmother     Review of Systems  Constitutional: Negative.   HENT: Negative.   Eyes: Negative.   Respiratory: Negative.   Gastrointestinal: Negative.   Genitourinary: Positive for difficulty urinating, dysuria, frequency and urgency. Negative for decreased urine volume, flank pain and vaginal discharge.      Medication List       Accurate as of  07/05/16  3:17 PM. Always use your most recent med list.          ALPRAZolam 1 MG tablet Commonly known as:  XANAX Take 1/2 - 1 tablet TID prn   esomeprazole 40 MG capsule Commonly known as:  NEXIUM Take 40 mg by mouth daily before breakfast. Reported on 02/28/2016   sulfamethoxazole-trimethoprim 800-160 MG tablet Commonly known as:  BACTRIM DS Take 1 tablet by mouth 2 (two) times daily.   tretinoin 0.05 % cream Commonly known as:  RETIN-A Apply topically at bedtime. Apply BB size amt at bedtime over entire face.          Objective:    BP 138/85   Pulse 70   Temp 98.4 F (36.9 C) (Oral)   Ht 5\' 8"  (1.727 m)   Wt 142 lb 6.4 oz (64.6 kg)   BMI 21.65 kg/m   No Known Allergies  Wt Readings from Last 3 Encounters:  07/05/16 142 lb 6.4 oz (64.6 kg)  03/18/16 143 lb 9.6 oz (65.1 kg)  02/28/16 143 lb 3.2 oz (65 kg)    Physical Exam  Constitutional: She is oriented to person, place, and time. She appears well-developed and well-nourished.  HENT:  Head: Normocephalic and atraumatic.  Eyes: Conjunctivae are normal. Pupils are  equal, round, and reactive to light.  Cardiovascular: Normal rate, regular rhythm, normal heart sounds and intact distal pulses.   Pulmonary/Chest: Effort normal and breath sounds normal.  Abdominal: Soft. Bowel sounds are normal. She exhibits no distension and no mass. There is tenderness in the suprapubic area. There is no rebound, no guarding and no CVA tenderness.  Neurological: She is alert and oriented to person, place, and time. She has normal reflexes.  Skin: Skin is warm and dry. No rash noted.  Psychiatric: She has a normal mood and affect. Her behavior is normal. Judgment and thought content normal.  Nursing note and vitals reviewed.       Assessment & Plan:   1. Dysuria - Urinalysis, Complete - sulfamethoxazole-trimethoprim (BACTRIM DS) 800-160 MG tablet; Take 1 tablet by mouth 2 (two) times daily.  Dispense: 14 tablet; Refill:  0  2. Acute cystitis with hematuria - sulfamethoxazole-trimethoprim (BACTRIM DS) 800-160 MG tablet; Take 1 tablet by mouth 2 (two) times daily.  Dispense: 14 tablet; Refill: 0  Continue all other maintenance medications as listed above.  Follow up plan: Return if symptoms worsen or fail to improve.  Orders Placed This Encounter  Procedures  . Urinalysis, Complete    Educational handout given for uti  Terald Sleeper PA-C [name] [address]  [place], [place] [address] [phone]   07/05/2016, 3:17 PM

## 2016-07-08 ENCOUNTER — Ambulatory Visit: Payer: BLUE CROSS/BLUE SHIELD

## 2016-07-29 DIAGNOSIS — N3020 Other chronic cystitis without hematuria: Secondary | ICD-10-CM | POA: Diagnosis not present

## 2016-07-29 DIAGNOSIS — R350 Frequency of micturition: Secondary | ICD-10-CM | POA: Diagnosis not present

## 2016-09-02 DIAGNOSIS — R399 Unspecified symptoms and signs involving the genitourinary system: Secondary | ICD-10-CM | POA: Diagnosis not present

## 2016-09-02 DIAGNOSIS — R8271 Bacteriuria: Secondary | ICD-10-CM | POA: Diagnosis not present

## 2016-10-07 ENCOUNTER — Other Ambulatory Visit: Payer: Self-pay | Admitting: Obstetrics and Gynecology

## 2016-10-07 DIAGNOSIS — Z1231 Encounter for screening mammogram for malignant neoplasm of breast: Secondary | ICD-10-CM

## 2016-10-28 ENCOUNTER — Other Ambulatory Visit: Payer: Self-pay | Admitting: Family

## 2016-10-28 DIAGNOSIS — F411 Generalized anxiety disorder: Secondary | ICD-10-CM

## 2016-10-29 NOTE — Telephone Encounter (Signed)
Please call in alprazolam with 1 refills 

## 2016-10-31 DIAGNOSIS — C44519 Basal cell carcinoma of skin of other part of trunk: Secondary | ICD-10-CM | POA: Diagnosis not present

## 2016-10-31 DIAGNOSIS — L820 Inflamed seborrheic keratosis: Secondary | ICD-10-CM | POA: Diagnosis not present

## 2016-11-04 ENCOUNTER — Ambulatory Visit: Payer: Self-pay

## 2016-11-15 DIAGNOSIS — C44519 Basal cell carcinoma of skin of other part of trunk: Secondary | ICD-10-CM | POA: Diagnosis not present

## 2016-11-18 ENCOUNTER — Ambulatory Visit: Payer: Self-pay

## 2017-01-08 DIAGNOSIS — H2513 Age-related nuclear cataract, bilateral: Secondary | ICD-10-CM | POA: Diagnosis not present

## 2017-01-08 DIAGNOSIS — H5203 Hypermetropia, bilateral: Secondary | ICD-10-CM | POA: Diagnosis not present

## 2017-01-21 DIAGNOSIS — N3020 Other chronic cystitis without hematuria: Secondary | ICD-10-CM | POA: Diagnosis not present

## 2017-01-21 DIAGNOSIS — R350 Frequency of micturition: Secondary | ICD-10-CM | POA: Diagnosis not present

## 2017-03-24 ENCOUNTER — Ambulatory Visit
Admission: RE | Admit: 2017-03-24 | Discharge: 2017-03-24 | Disposition: A | Discharge status: Home / Self Care | Payer: BLUE CROSS/BLUE SHIELD | Source: Ambulatory Visit | Attending: Obstetrics and Gynecology | Admitting: Obstetrics and Gynecology

## 2017-03-24 DIAGNOSIS — Z1231 Encounter for screening mammogram for malignant neoplasm of breast: Secondary | ICD-10-CM | POA: Diagnosis not present

## 2017-09-12 ENCOUNTER — Telehealth: Payer: Self-pay | Admitting: Nurse Practitioner

## 2017-09-13 ENCOUNTER — Other Ambulatory Visit: Payer: Self-pay | Admitting: Nurse Practitioner

## 2017-09-13 DIAGNOSIS — F411 Generalized anxiety disorder: Secondary | ICD-10-CM

## 2017-09-25 ENCOUNTER — Ambulatory Visit: Payer: BLUE CROSS/BLUE SHIELD | Admitting: Nurse Practitioner

## 2017-09-25 ENCOUNTER — Encounter: Payer: Self-pay | Admitting: Nurse Practitioner

## 2017-09-25 VITALS — BP 118/72 | HR 69 | Temp 97.1°F | Wt 139.2 lb

## 2017-09-25 DIAGNOSIS — F411 Generalized anxiety disorder: Secondary | ICD-10-CM

## 2017-09-25 DIAGNOSIS — J209 Acute bronchitis, unspecified: Secondary | ICD-10-CM | POA: Diagnosis not present

## 2017-09-25 MED ORDER — ALPRAZOLAM 1 MG PO TABS: ORAL_TABLET | ORAL | 0 refills | Status: DC

## 2017-09-25 MED ORDER — PREDNISONE 10 MG (21) PO TBPK: ORAL_TABLET | ORAL | 0 refills | Status: DC

## 2017-09-25 MED ORDER — HYDROCODONE-HOMATROPINE 5-1.5 MG/5ML PO SYRP: 5.0000 mL | ORAL_SOLUTION | Freq: Four times a day (QID) | ORAL | 0 refills | Status: DC | PRN

## 2017-09-25 NOTE — Patient Instructions (Signed)

## 2017-09-25 NOTE — Progress Notes (Signed)
Subjective:     [name] is a 52 y.o. female here for evaluation of a cough. Onset of symptoms was 10 days ago. Symptoms have been unchanged since that time. The cough is barky, dry, harsh and nonproductive and is aggravated by cold air and reclining position. Associated symptoms include: change in voice. Patient does not have a history of asthma. Patient does not have a history of environmental allergens. Patient has not traveled recently. Patient does not have a history of smoking. Patient has had a previous chest x-ray. Patient has had a PPD done.  The following portions of the patient's history were reviewed and updated as appropriate: allergies, current medications, past family history, past medical history, past social history, past surgical history and problem list.  * also needs refill of xanax that she takes on prn basis for anxiety Review of Systems Pertinent items noted in HPI and remainder of comprehensive ROS otherwise negative.    Objective:     BP 118/72 (BP Location: Left Arm, Patient Position: Sitting, Cuff Size: Normal)   Pulse 69   Temp (!) 97.1 F (36.2 C) (Oral)   Wt 139 lb 3.2 oz (63.1 kg)   LMP 04/24/2015   BMI 21.17 kg/m  General appearance: alert, cooperative and no distress Eyes: conjunctivae/corneas clear. PERRL, EOM's intact. Fundi benign. Ears: normal TM's and external ear canals both ears Nose: clear discharge, mild congestion, turbinates red, no sinus tenderness Throat: lips, mucosa, and tongue normal; teeth and gums normal Neck: no adenopathy, no carotid bruit, no JVD, supple, symmetrical, trachea midline and thyroid not enlarged, symmetric, no tenderness/mass/nodules Lungs: clear to auscultation bilaterally and deep dry cough Heart: regular rate and rhythm, S1, S2 normal, no murmur, click, rub or gallop    Assessment:    Acute Bronchitis   GAD   Plan:  1. Acute bronchitis, unspecified organism 1. Take meds as prescribed 2. Use a cool mist  humidifier especially during the winter months and when heat has been humid. 3. Use saline nose sprays frequently 4. Saline irrigations of the nose can be very helpful if done frequently.  * 4X daily for 1 week*  * Use of a nettie pot can be helpful with this. Follow directions with this* 5. Drink plenty of fluids 6. Keep thermostat turn down low 7.For any cough or congestion  Use plain Mucinex- regular strength or max strength is fine   * Children- consult with Pharmacist for dosing 8. For fever or aces or pains- take tylenol or ibuprofen appropriate for age and weight.  * for fevers greater than 101 orally you may alternate ibuprofen and tylenol every  3 hours.    - predniSONE (STERAPRED UNI-PAK 21 TAB) 10 MG (21) TBPK tablet; As directed x 6 days  Dispense: 21 tablet; Refill: 0 - HYDROcodone-homatropine (HYCODAN) 5-1.5 MG/5ML syrup; Take 5 mLs by mouth every 6 (six) hours as needed for cough.  Dispense: 120 mL; Refill: 0  2. GAD (generalized anxiety disorder) Stress ,management - ALPRAZolam (XANAX) 1 MG tablet; TAKE 1/2-1 TABLET 3 TIMES A DAY AS NEEDED  Dispense: 90 tablet; Refill: 0     Mary-Margaret Hassell Done, FNP

## 2017-10-02 ENCOUNTER — Other Ambulatory Visit: Payer: Self-pay

## 2017-10-02 ENCOUNTER — Emergency Department (HOSPITAL_BASED_OUTPATIENT_CLINIC_OR_DEPARTMENT_OTHER)
Admission: EM | Admit: 2017-10-02 | Discharge: 2017-10-03 | Disposition: A | Discharge status: Home / Self Care | Payer: BLUE CROSS/BLUE SHIELD | Attending: Emergency Medicine | Admitting: Emergency Medicine

## 2017-10-02 ENCOUNTER — Encounter (HOSPITAL_BASED_OUTPATIENT_CLINIC_OR_DEPARTMENT_OTHER): Payer: Self-pay | Admitting: *Deleted

## 2017-10-02 DIAGNOSIS — Z23 Encounter for immunization: Secondary | ICD-10-CM | POA: Diagnosis not present

## 2017-10-02 DIAGNOSIS — Z79899 Other long term (current) drug therapy: Secondary | ICD-10-CM | POA: Diagnosis not present

## 2017-10-02 DIAGNOSIS — Y9389 Activity, other specified: Secondary | ICD-10-CM | POA: Insufficient documentation

## 2017-10-02 DIAGNOSIS — S61213A Laceration without foreign body of left middle finger without damage to nail, initial encounter: Secondary | ICD-10-CM | POA: Insufficient documentation

## 2017-10-02 DIAGNOSIS — Y929 Unspecified place or not applicable: Secondary | ICD-10-CM | POA: Insufficient documentation

## 2017-10-02 DIAGNOSIS — Y999 Unspecified external cause status: Secondary | ICD-10-CM | POA: Diagnosis not present

## 2017-10-02 DIAGNOSIS — W268XXA Contact with other sharp object(s), not elsewhere classified, initial encounter: Secondary | ICD-10-CM | POA: Diagnosis not present

## 2017-10-02 DIAGNOSIS — S6992XA Unspecified injury of left wrist, hand and finger(s), initial encounter: Secondary | ICD-10-CM | POA: Diagnosis not present

## 2017-10-02 DIAGNOSIS — M79645 Pain in left finger(s): Secondary | ICD-10-CM | POA: Diagnosis not present

## 2017-10-02 NOTE — ED Triage Notes (Signed)
Laceration to her left middle finger this afternoon while cutting hair. Unable to control the bleeding without direct pressure.

## 2017-10-03 MED ORDER — "THROMBI-PAD 3""X3"" EX PADS"
1.0000 | MEDICATED_PAD | Freq: Once | CUTANEOUS | Status: DC
  Filled 2017-10-03: qty 1

## 2017-10-03 MED ORDER — TETANUS-DIPHTH-ACELL PERTUSSIS 5-2.5-18.5 LF-MCG/0.5 IM SUSP
0.5000 mL | Freq: Once | INTRAMUSCULAR | Status: AC
  Administered 2017-10-03: 0.5 mL via INTRAMUSCULAR
  Filled 2017-10-03: qty 0.5

## 2017-10-03 NOTE — ED Provider Notes (Signed)
[facility] Provider Note: Georgena Spurling, MD, FACEP  CSN: [id number] MRN: [id number] ARRIVAL: 10/02/17 at [place]: [place]  Laceration   HISTORY OF PRESENT ILLNESS  10/03/17 1:23 AM [name] is a 52 y.o. female who shaved off a piece of the pad of her left middle finger while cutting hair yesterday afternoon about 4 PM.  She has had persistent bleeding of the wound despite pressure and "liquid bandage".  She is having pain at the site which she rates is about 3 out of 10.  She denies other injury.  Her tetanus is not up-to-date.   Past Medical History:  Diagnosis Date  . Esophageal reflux   . Heart murmur     Past Surgical History:  Procedure Laterality Date  . CHOLECYSTECTOMY     2008    Family History  Problem Relation Age of Onset  . Diabetes Maternal Grandmother     Social History   Tobacco Use  . Smoking status: Never Smoker  . Smokeless tobacco: Never Used  Substance Use Topics  . Alcohol use: Yes    Comment: occas  . Drug use: No    Prior to Admission medications   Medication Sig Start Date End Date Taking? Authorizing Provider  ALPRAZolam Duanne Moron) 1 MG tablet TAKE 1/2-1 TABLET 3 TIMES A DAY AS NEEDED 09/25/17   Chevis Pretty, FNP  esomeprazole (NEXIUM) 40 MG capsule Take 40 mg by mouth daily before breakfast. Reported on 02/28/2016    [provider]  HYDROcodone-homatropine (HYCODAN) 5-1.5 MG/5ML syrup Take 5 mLs by mouth every 6 (six) hours as needed for cough. 09/25/17   Hassell Done, Mary-Margaret, FNP  predniSONE (STERAPRED UNI-PAK 21 TAB) 10 MG (21) TBPK tablet As directed x 6 days 09/25/17   Chevis Pretty, FNP    Allergies Patient has no known allergies.   REVIEW OF SYSTEMS  Negative except as noted here or in the History of Present Illness.   PHYSICAL EXAMINATION  Initial Vital Signs Blood pressure (!) 159/67, pulse 61, temperature 98 F (36.7 C), temperature source Oral, resp. rate  18, height 5\' 9"  (1.753 m), weight 63 kg (139 lb), last menstrual period 04/24/2015, SpO2 99 %.  Examination General: Well-developed, well-nourished female in no acute distress; appearance consistent with age of record HENT: normocephalic; atraumatic Eyes: Normal appearance Neck: supple Heart: regular rate and rhythm Lungs: clear to auscultation bilaterally Abdomen: soft; nondistended; nontender; bowel sounds present Extremities: No deformity; full range of motion Neurologic: Awake, alert and oriented; motor function intact in all extremities and symmetric; no facial droop Skin: Warm and dry; partial thickness avulsion of skin of pad of left middle finger, no skin flap remaining Psychiatric: Normal mood and affect   RESULTS  Summary of this visit's results, reviewed by myself:   EKG Interpretation  Date/Time:    Ventricular Rate:    PR Interval:    QRS Duration:   QT Interval:    QTC Calculation:   R Axis:     Text Interpretation:        Laboratory Studies: No results found for this or any previous visit (from the past 24 hour(s)). Imaging Studies: No results found.  ED COURSE  Nursing notes and initial vitals signs, including pulse oximetry, reviewed.  Vitals:   10/02/17 2237 10/02/17 2238  BP:  (!) 159/67  Pulse:  61  Resp:  18  Temp:  98 F (36.7 C)  TempSrc:  Oral  SpO2:  99%  Weight: 63 kg (139 lb)   Height: 5\' 9"  (1.753 m)    Will update tetanus and bandage wound with thrombin pad.  PROCEDURES    ED DIAGNOSES     ICD-10-CM   1. Laceration of left middle finger without foreign body without damage to nail, initial encounter A56.979Y        Shanon Rosser, MD 10/03/17 [phone]

## 2017-10-03 NOTE — ED Notes (Signed)
ED Provider at bedside. 

## 2017-10-06 ENCOUNTER — Ambulatory Visit: Payer: BLUE CROSS/BLUE SHIELD | Admitting: Nurse Practitioner

## 2017-10-15 DIAGNOSIS — L570 Actinic keratosis: Secondary | ICD-10-CM | POA: Diagnosis not present

## 2017-12-04 ENCOUNTER — Encounter: Payer: Self-pay | Admitting: Nurse Practitioner

## 2017-12-04 ENCOUNTER — Ambulatory Visit: Payer: BLUE CROSS/BLUE SHIELD | Admitting: Nurse Practitioner

## 2017-12-04 VITALS — BP 113/74 | HR 68 | Temp 97.4°F | Ht 69.0 in | Wt 136.0 lb

## 2017-12-04 DIAGNOSIS — H65192 Other acute nonsuppurative otitis media, left ear: Secondary | ICD-10-CM

## 2017-12-04 DIAGNOSIS — H6122 Impacted cerumen, left ear: Secondary | ICD-10-CM

## 2017-12-04 DIAGNOSIS — H9202 Otalgia, left ear: Secondary | ICD-10-CM

## 2017-12-04 MED ORDER — FLUTICASONE PROPIONATE 50 MCG/ACT NA SUSP: 2.0000 | Freq: Every day | NASAL | 6 refills | Status: DC

## 2017-12-04 NOTE — Patient Instructions (Signed)
Earwax Buildup, Adult The ears produce a substance called earwax that helps keep bacteria out of the ear and protects the skin in the ear canal. Occasionally, earwax can build up in the ear and cause discomfort or hearing loss. What increases the risk? This condition is more likely to develop in people who:  Are female.  Are elderly.  Naturally produce more earwax.  Clean their ears often with cotton swabs.  Use earplugs often.  Use in-ear headphones often.  Wear hearing aids.  Have narrow ear canals.  Have earwax that is overly thick or sticky.  Have eczema.  Are dehydrated.  Have excess hair in the ear canal.  What are the signs or symptoms? Symptoms of this condition include:  Reduced or muffled hearing.  A feeling of fullness in the ear or feeling that the ear is plugged.  Fluid coming from the ear.  Ear pain.  Ear itch.  Ringing in the ear.  Coughing.  An obvious piece of earwax that can be seen inside the ear canal.  How is this diagnosed? This condition may be diagnosed based on:  Your symptoms.  Your medical history.  An ear exam. During the exam, your health care provider will look into your ear with an instrument called an otoscope.  You may have tests, including a hearing test. How is this treated? This condition may be treated by:  Using ear drops to soften the earwax.  Having the earwax removed by a health care provider. The health care provider may: ? Flush the ear with water. ? Use an instrument that has a loop on the end (curette). ? Use a suction device.  Surgery to remove the wax buildup. This may be done in severe cases.  Follow these instructions at home:  Take over-the-counter and prescription medicines only as told by your health care provider.  Do not put any objects, including cotton swabs, into your ear. You can clean the opening of your ear canal with a washcloth or facial tissue.  Follow instructions from your health  care provider about cleaning your ears. Do not over-clean your ears.  Drink enough fluid to keep your urine clear or pale yellow. This will help to thin the earwax.  Keep all follow-up visits as told by your health care provider. If earwax builds up in your ears often or if you use hearing aids, consider seeing your health care provider for routine, preventive ear cleanings. Ask your health care provider how often you should schedule your cleanings.  If you have hearing aids, clean them according to instructions from the manufacturer and your health care provider. Contact a health care provider if:  You have ear pain.  You develop a fever.  You have blood, pus, or other fluid coming from your ear.  You have hearing loss.  You have ringing in your ears that does not go away.  Your symptoms do not improve with treatment.  You feel like the room is spinning (vertigo). Summary  Earwax can build up in the ear and cause discomfort or hearing loss.  The most common symptoms of this condition include reduced or muffled hearing and a feeling of fullness in the ear or feeling that the ear is plugged.  This condition may be diagnosed based on your symptoms, your medical history, and an ear exam.  This condition may be treated by using ear drops to soften the earwax or by having the earwax removed by a health care provider.  Do   not put any objects, including cotton swabs, into your ear. You can clean the opening of your ear canal with a washcloth or facial tissue. This information is not intended to replace advice given to you by your health care provider. Make sure you discuss any questions you have with your health care provider. Document Released: 10/17/2004 Document Revised: 11/20/2016 Document Reviewed: 11/20/2016 Elsevier Interactive Patient Education  2018 [employer] Inc[employer]  

## 2017-12-04 NOTE — Progress Notes (Signed)
   Subjective:    Patient ID: [name], female    DOB: [birth year], 52 y.o.   MRN: [id number]  HPI  Patient comes in today c/o  Left ear pain. Started about 2 weeks ago. Has been hurting since she got back from a trip in which she had to fly. No fever. She tried an OTC decongestant on which she saw a difference.   Review of Systems  Constitutional: Negative for chills and fever.  HENT: Positive for ear pain. Negative for congestion, postnasal drip, rhinorrhea, sinus pressure, sinus pain, sore throat and trouble swallowing.   Respiratory: Negative for cough.   Cardiovascular: Negative.   Gastrointestinal: Negative.   Neurological: Negative for headaches.  Psychiatric/Behavioral: Negative.   All other systems reviewed and are negative.      Objective:   Physical Exam  Constitutional: She is oriented to person, place, and time. She appears well-developed and well-nourished. No distress.  HENT:  Right Ear: Hearing, tympanic membrane, external ear and ear canal normal.  Cerumen impaction left ear  Cardiovascular: Normal rate and regular rhythm.  Pulmonary/Chest: Effort normal and breath sounds normal.  Neurological: She is alert and oriented to person, place, and time.  Skin: Skin is warm.  Psychiatric: She has a normal mood and affect. Her behavior is normal. Judgment and thought content normal.   BP 113/74   Pulse 68   Temp (!) 97.4 F (36.3 C) (Oral)   Ht 5\' 9"  (1.753 m)   Wt 136 lb (61.7 kg)   LMP 04/24/2015   BMI 20.08 kg/m   S/P left ear irrigation- TM clear with clear effusion       Assessment & Plan:   1. Left ear pain   2. Impacted cerumen of left ear   3. Acute MEE (middle ear effusion), left    Meds ordered this encounter  Medications  . fluticasone (FLONASE) 50 MCG/ACT nasal spray    Sig: Place 2 sprays into both nostrils daily.    Dispense:  16 g    Refill:  6    Order Specific Question:   Supervising Provider    Answer:   Eustaquio Maize  [[id number]]   Force fluds Debrox OTC for ear wax rto prn  Mary-Margaret Hassell Done, FNP

## 2018-01-12 DIAGNOSIS — Z124 Encounter for screening for malignant neoplasm of cervix: Secondary | ICD-10-CM | POA: Diagnosis not present

## 2018-01-12 DIAGNOSIS — Z130 Encounter for screening for diseases of the blood and blood-forming organs and certain disorders involving the immune mechanism: Secondary | ICD-10-CM | POA: Diagnosis not present

## 2018-01-12 DIAGNOSIS — Z1389 Encounter for screening for other disorder: Secondary | ICD-10-CM | POA: Diagnosis not present

## 2018-01-12 DIAGNOSIS — Z01419 Encounter for gynecological examination (general) (routine) without abnormal findings: Secondary | ICD-10-CM | POA: Diagnosis not present

## 2018-01-12 DIAGNOSIS — R300 Dysuria: Secondary | ICD-10-CM | POA: Diagnosis not present

## 2018-01-12 DIAGNOSIS — Z6821 Body mass index (BMI) 21.0-21.9, adult: Secondary | ICD-10-CM | POA: Diagnosis not present

## 2018-03-30 DIAGNOSIS — Z01419 Encounter for gynecological examination (general) (routine) without abnormal findings: Secondary | ICD-10-CM | POA: Diagnosis not present

## 2018-04-13 DIAGNOSIS — M25562 Pain in left knee: Secondary | ICD-10-CM | POA: Diagnosis not present

## 2018-04-17 ENCOUNTER — Encounter: Payer: Self-pay | Admitting: Nurse Practitioner

## 2018-04-17 ENCOUNTER — Ambulatory Visit: Payer: BLUE CROSS/BLUE SHIELD | Admitting: Nurse Practitioner

## 2018-04-17 VITALS — BP 136/86 | HR 62 | Temp 97.0°F | Ht 69.0 in | Wt 138.0 lb

## 2018-04-17 DIAGNOSIS — F411 Generalized anxiety disorder: Secondary | ICD-10-CM | POA: Diagnosis not present

## 2018-04-17 DIAGNOSIS — K219 Gastro-esophageal reflux disease without esophagitis: Secondary | ICD-10-CM | POA: Diagnosis not present

## 2018-04-17 MED ORDER — ALPRAZOLAM 1 MG PO TABS: ORAL_TABLET | ORAL | 0 refills | Status: DC

## 2018-04-17 NOTE — Progress Notes (Signed)
   Subjective:    Patient ID: [name], female    DOB: December 10, 1965, 52 y.o.   MRN: [id number]   Chief Complaint: Medical Management of Chronic Issues   HPI:  1. Gastroesophageal reflux disease without esophagitis  Takes an occasionl zantac when needed. Has not needed anything in the last several months.  2. GAD (generalized anxiety disorder)  Takes xanax on as needed basis. Does not take everyday. Does take 4-5 x a week though but mainly at nighttime.    Outpatient Encounter Medications as of 04/17/2018  Medication Sig  . ALPRAZolam (XANAX) 1 MG tablet TAKE 1/2-1 TABLET 3 TIMES A DAY AS NEEDED  . fluticasone (FLONASE) 50 MCG/ACT nasal spray Place 2 sprays into both nostrils daily.    New complaints: None today  Social history: Lives with husband and 82 year old child  Review of Systems  Constitutional: Negative for activity change and appetite change.  HENT: Negative.   Eyes: Negative for pain.  Respiratory: Negative for shortness of breath.   Cardiovascular: Negative for chest pain, palpitations and leg swelling.  Gastrointestinal: Negative for abdominal pain.  Endocrine: Negative for polydipsia.  Genitourinary: Negative.   Skin: Negative for rash.  Neurological: Negative for dizziness, weakness and headaches.  Hematological: Does not bruise/bleed easily.  Psychiatric/Behavioral: Negative.   All other systems reviewed and are negative.      Objective:   Physical Exam  Constitutional: She is oriented to person, place, and time. She appears well-developed and well-nourished.  HENT:  Head: Normocephalic.  Nose: Nose normal.  Mouth/Throat: Oropharynx is clear and moist.  Eyes: Pupils are equal, round, and reactive to light. EOM are normal.  Neck: Normal range of motion. Neck supple. No JVD present. Carotid bruit is not present.  Cardiovascular: Normal rate, regular rhythm, normal heart sounds and intact distal pulses.  Pulmonary/Chest: Effort normal and breath  sounds normal. No respiratory distress. She has no wheezes. She has no rales. She exhibits no tenderness.  Abdominal: Soft. Normal appearance, normal aorta and bowel sounds are normal. She exhibits no distension, no abdominal bruit, no pulsatile midline mass and no mass. There is no splenomegaly or hepatomegaly. There is no tenderness.  Musculoskeletal: Normal range of motion. She exhibits no edema.  Lymphadenopathy:    She has no cervical adenopathy.  Neurological: She is alert and oriented to person, place, and time. She has normal reflexes.  Skin: Skin is warm and dry.  Psychiatric: She has a normal mood and affect. Her behavior is normal. Judgment and thought content normal.   BP 136/86   Pulse 62   Temp (!) 97 F (36.1 C) (Oral)   Ht 5\' 9"  (1.753 m)   Wt 138 lb (62.6 kg)   LMP 04/24/2015   BMI 20.38 kg/m         Assessment & Plan:  [name] comes in today with chief complaint of Medical Management of Chronic Issues   Diagnosis and orders addressed:  1. Gastroesophageal reflux disease without esophagitis Avoid spicy foods Do not eat 2 hours prior to bedtime  2. GAD (generalized anxiety disorder) Stress management - ALPRAZolam (XANAX) 1 MG tablet; TAKE 1/2-1 TABLET 3 TIMES A DAY AS NEEDED  Dispense: 90 tablet; Refill: 0   Labs pending Health Maintenance reviewed Diet and exercise encouraged  Follow up plan: 3 months   Mary-Margaret Hassell Done, FNP

## 2018-04-17 NOTE — Patient Instructions (Signed)
Stress and Stress Management Stress is a normal reaction to life events. It is what you feel when life demands more than you are used to or more than you can handle. Some stress can be useful. For example, the stress reaction can help you catch the last bus of the day, study for a test, or meet a deadline at work. But stress that occurs too often or for too long can cause problems. It can affect your emotional health and interfere with relationships and normal daily activities. Too much stress can weaken your immune system and increase your risk for physical illness. If you already have a medical problem, stress can make it worse. What are the causes? All sorts of life events may cause stress. An event that causes stress for one person may not be stressful for another person. Major life events commonly cause stress. These may be positive or negative. Examples include losing your job, moving into a new home, getting married, having a baby, or losing a loved one. Less obvious life events may also cause stress, especially if they occur day after day or in combination. Examples include working long hours, driving in traffic, caring for children, being in debt, or being in a difficult relationship. What are the signs or symptoms? Stress may cause emotional symptoms including, the following:  Anxiety. This is feeling worried, afraid, on edge, overwhelmed, or out of control.  Anger. This is feeling irritated or impatient.  Depression. This is feeling sad, down, helpless, or guilty.  Difficulty focusing, remembering, or making decisions.  Stress may cause physical symptoms, including the following:  Aches and pains. These may affect your head, neck, back, stomach, or other areas of your body.  Tight muscles or clenched jaw.  Low energy or trouble sleeping.  Stress may cause unhealthy behaviors, including the following:  Eating to feel better (overeating) or skipping meals.  Sleeping too little,  too much, or both.  Working too much or putting off tasks (procrastination).  Smoking, drinking alcohol, or using drugs to feel better.  How is this diagnosed? Stress is diagnosed through an assessment by your health care provider. Your health care provider will ask questions about your symptoms and any stressful life events.Your health care provider will also ask about your medical history and may order blood tests or other tests. Certain medical conditions and medicine can cause physical symptoms similar to stress. Mental illness can cause emotional symptoms and unhealthy behaviors similar to stress. Your health care provider may refer you to a mental health professional for further evaluation. How is this treated? Stress management is the recommended treatment for stress.The goals of stress management are reducing stressful life events and coping with stress in healthy ways. Techniques for reducing stressful life events include the following:  Stress identification. Self-monitor for stress and identify what causes stress for you. These skills may help you to avoid some stressful events.  Time management. Set your priorities, keep a calendar of events, and learn to say "no." These tools can help you avoid making too many commitments.  Techniques for coping with stress include the following:  Rethinking the problem. Try to think realistically about stressful events rather than ignoring them or overreacting. Try to find the positives in a stressful situation rather than focusing on the negatives.  Exercise. Physical exercise can release both physical and emotional tension. The key is to find a form of exercise you enjoy and do it regularly.  Relaxation techniques. These relax the body and  mind. Examples include yoga, meditation, tai chi, biofeedback, deep breathing, progressive muscle relaxation, listening to music, being out in nature, journaling, and other hobbies. Again, the key is to find  one or more that you enjoy and can do regularly.  Healthy lifestyle. Eat a balanced diet, get plenty of sleep, and do not smoke. Avoid using alcohol or drugs to relax.  Strong support network. Spend time with family, friends, or other people you enjoy being around.Express your feelings and talk things over with someone you trust.  Counseling or talktherapy with a mental health professional may be helpful if you are having difficulty managing stress on your own. Medicine is typically not recommended for the treatment of stress.Talk to your health care provider if you think you need medicine for symptoms of stress. Follow these instructions at home:  Keep all follow-up visits as directed by your health care provider.  Take all medicines as directed by your health care provider. Contact a health care provider if:  Your symptoms get worse or you start having new symptoms.  You feel overwhelmed by your problems and can no longer manage them on your own. Get help right away if:  You feel like hurting yourself or someone else. This information is not intended to replace advice given to you by your health care provider. Make sure you discuss any questions you have with your health care provider. Document Released: 03/05/2001 Document Revised: 02/15/2016 Document Reviewed: 05/04/2013 Elsevier Interactive Patient Education  2017 [employer] Inc.  

## 2018-05-06 DIAGNOSIS — Z85828 Personal history of other malignant neoplasm of skin: Secondary | ICD-10-CM | POA: Diagnosis not present

## 2018-05-06 DIAGNOSIS — D2371 Other benign neoplasm of skin of right lower limb, including hip: Secondary | ICD-10-CM | POA: Diagnosis not present

## 2018-05-06 DIAGNOSIS — D2261 Melanocytic nevi of right upper limb, including shoulder: Secondary | ICD-10-CM | POA: Diagnosis not present

## 2018-06-24 DIAGNOSIS — J018 Other acute sinusitis: Secondary | ICD-10-CM | POA: Diagnosis not present

## 2018-07-23 ENCOUNTER — Other Ambulatory Visit: Payer: Self-pay | Admitting: Obstetrics and Gynecology

## 2018-07-23 DIAGNOSIS — Z1231 Encounter for screening mammogram for malignant neoplasm of breast: Secondary | ICD-10-CM

## 2018-07-29 DIAGNOSIS — D485 Neoplasm of uncertain behavior of skin: Secondary | ICD-10-CM | POA: Diagnosis not present

## 2018-07-29 DIAGNOSIS — D2371 Other benign neoplasm of skin of right lower limb, including hip: Secondary | ICD-10-CM | POA: Diagnosis not present

## 2018-09-07 ENCOUNTER — Ambulatory Visit: Payer: BLUE CROSS/BLUE SHIELD

## 2018-10-08 ENCOUNTER — Telehealth: Payer: Self-pay | Admitting: Nurse Practitioner

## 2018-10-08 NOTE — Telephone Encounter (Signed)
Appt made for patient

## 2018-10-08 NOTE — Telephone Encounter (Signed)
I do not see anything, maybe u can work her in?

## 2018-10-12 ENCOUNTER — Encounter: Payer: Self-pay | Admitting: Nurse Practitioner

## 2018-10-12 ENCOUNTER — Ambulatory Visit: Payer: BLUE CROSS/BLUE SHIELD | Admitting: Nurse Practitioner

## 2018-10-12 VITALS — BP 125/80 | HR 69 | Temp 97.2°F | Ht 69.0 in | Wt 138.0 lb

## 2018-10-12 DIAGNOSIS — F411 Generalized anxiety disorder: Secondary | ICD-10-CM | POA: Diagnosis not present

## 2018-10-12 DIAGNOSIS — Z23 Encounter for immunization: Secondary | ICD-10-CM

## 2018-10-12 MED ORDER — ALPRAZOLAM 1 MG PO TABS: ORAL_TABLET | ORAL | 0 refills | Status: DC

## 2018-10-12 NOTE — Progress Notes (Signed)
   Subjective:    Patient ID: [name], female    DOB: May 29, 1966, 53 y.o.   MRN: [id number]   Chief Complaint: Medication Refill   HPI Patient comes in today for refill of xanax. She takes this for anxiety and usually takes 1mg  of xanax 1/2 -1 tablet daily. Has not had a physical in over 3 years.    Review of Systems  Respiratory: Negative.   Cardiovascular: Negative.   Neurological: Negative.   Psychiatric/Behavioral: Negative.   All other systems reviewed and are negative.      Objective:   Physical Exam Constitutional:      Appearance: Normal appearance. She is normal weight.  Cardiovascular:     Rate and Rhythm: Normal rate and regular rhythm.     Pulses: Normal pulses.     Heart sounds: Normal heart sounds.  Abdominal:     General: Abdomen is flat.  Skin:    General: Skin is warm.  Neurological:     General: No focal deficit present.     Mental Status: She is alert and oriented to person, place, and time.  Psychiatric:        Mood and Affect: Mood normal.        Behavior: Behavior normal.    BP 125/80   Pulse 69   Temp (!) 97.2 F (36.2 C) (Oral)   Ht 5\' 9"  (1.753 m)   Wt 138 lb (62.6 kg)   LMP 04/24/2015   BMI 20.38 kg/m       Assessment & Plan:  [name] in today with chief complaint of Medication Refill   1. GAD (generalized anxiety disorder) Stress management RTO for CPE in 3 months - ALPRAZolam (XANAX) 1 MG tablet; TAKE 1/2-1 TABLET 3 TIMES A DAY AS NEEDED  Dispense: 90 tablet; Refill: 0   Mary-Margaret Hassell Done, FNP

## 2018-10-12 NOTE — Patient Instructions (Signed)

## 2018-10-19 ENCOUNTER — Ambulatory Visit
Admission: RE | Admit: 2018-10-19 | Discharge: 2018-10-19 | Disposition: A | Discharge status: Home / Self Care | Payer: BLUE CROSS/BLUE SHIELD | Source: Ambulatory Visit | Attending: Obstetrics and Gynecology | Admitting: Obstetrics and Gynecology

## 2018-10-19 DIAGNOSIS — Z1231 Encounter for screening mammogram for malignant neoplasm of breast: Secondary | ICD-10-CM | POA: Diagnosis not present

## 2018-10-23 ENCOUNTER — Ambulatory Visit: Payer: BLUE CROSS/BLUE SHIELD | Admitting: Family Medicine

## 2018-10-24 DIAGNOSIS — J00 Acute nasopharyngitis [common cold]: Secondary | ICD-10-CM | POA: Diagnosis not present

## 2018-10-26 DIAGNOSIS — J018 Other acute sinusitis: Secondary | ICD-10-CM | POA: Diagnosis not present

## 2019-01-22 ENCOUNTER — Ambulatory Visit (INDEPENDENT_AMBULATORY_CARE_PROVIDER_SITE_OTHER): Payer: BLUE CROSS/BLUE SHIELD | Admitting: Nurse Practitioner

## 2019-01-22 ENCOUNTER — Other Ambulatory Visit: Payer: Self-pay

## 2019-01-22 ENCOUNTER — Encounter: Payer: Self-pay | Admitting: Nurse Practitioner

## 2019-01-22 DIAGNOSIS — F411 Generalized anxiety disorder: Secondary | ICD-10-CM

## 2019-01-22 NOTE — Progress Notes (Signed)
   Virtual Visit via telephone Note  I connected with [name] on 01/22/19 at 10:00 AM by telephone and verified that I am speaking with the correct person using two identifiers. [name] is currently located at home and  Her son  is currently with her during visit. The provider, Mary-Margaret Hassell Done, FNP is located in their office at time of visit.  I discussed the limitations, risks, security and privacy concerns of performing an evaluation and management service by telephone and the availability of in person appointments. I also discussed with the patient that there may be a patient responsible charge related to this service. The patient expressed understanding and agreed to proceed.   History and Present Illness:   Chief Complaint: No chief complaint on file.    HPI:  1. GAD (generalized anxiety disorder) Patient is on xanax 1mg  daily. Her anxiety has worsened some since she is currently out of work.    Outpatient Encounter Medications as of 01/22/2019  Medication Sig  . ALPRAZolam (XANAX) 1 MG tablet TAKE 1/2-1 TABLET 3 TIMES A DAY AS NEEDED  . fluticasone (FLONASE) 50 MCG/ACT nasal spray Place 2 sprays into both nostrils daily.      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      Review of Systems  Constitutional: Negative for diaphoresis and weight loss.  Eyes: Negative for blurred vision, double vision and pain.  Respiratory: Negative for shortness of breath.   Cardiovascular: Negative for chest pain, palpitations, orthopnea and leg swelling.  Gastrointestinal: Negative for abdominal pain.  Skin: Negative for rash.  Neurological: Negative for dizziness, sensory change, loss of consciousness, weakness and headaches.  Endo/Heme/Allergies: Negative for polydipsia. Does not bruise/bleed easily.  Psychiatric/Behavioral: Negative for memory loss. The patient does not have insomnia.   All other  systems reviewed and are negative.    Observations/Objective: Alert and oriented- answers all questions appropriately No distress  Assessment and Plan: Alert and oriented- answers all questions appropriately  Follow Up Instructions: 6 months    I discussed the assessment and treatment plan with the patient. The patient was provided an opportunity to ask questions and all were answered. The patient agreed with the plan and demonstrated an understanding of the instructions.   The patient was advised to call back or seek an in-person evaluation if the symptoms worsen or if the condition fails to improve as anticipated.  The above assessment and management plan was discussed with the patient. The patient verbalized understanding of and has agreed to the management plan. Patient is aware to call the clinic if symptoms persist or worsen. Patient is aware when to return to the clinic for a follow-up visit. Patient educated on when it is appropriate to go to the emergency department.   Time call ended:  10:10  I provided 10 minutes of non-face-to-face time during this encounter.    Mary-Margaret Hassell Done, FNP

## 2019-02-11 ENCOUNTER — Other Ambulatory Visit: Payer: Self-pay | Admitting: Nurse Practitioner

## 2019-02-11 DIAGNOSIS — F411 Generalized anxiety disorder: Secondary | ICD-10-CM

## 2019-02-17 ENCOUNTER — Other Ambulatory Visit: Payer: Self-pay | Admitting: Nurse Practitioner

## 2019-02-17 DIAGNOSIS — F411 Generalized anxiety disorder: Secondary | ICD-10-CM

## 2019-02-18 NOTE — Telephone Encounter (Signed)
Last seen 01/22/2019

## 2019-04-29 DIAGNOSIS — L570 Actinic keratosis: Secondary | ICD-10-CM | POA: Diagnosis not present

## 2019-06-07 DIAGNOSIS — Z01419 Encounter for gynecological examination (general) (routine) without abnormal findings: Secondary | ICD-10-CM | POA: Diagnosis not present

## 2019-06-07 DIAGNOSIS — Z130 Encounter for screening for diseases of the blood and blood-forming organs and certain disorders involving the immune mechanism: Secondary | ICD-10-CM | POA: Diagnosis not present

## 2019-06-07 DIAGNOSIS — Z1389 Encounter for screening for other disorder: Secondary | ICD-10-CM | POA: Diagnosis not present

## 2019-06-08 DIAGNOSIS — Z20828 Contact with and (suspected) exposure to other viral communicable diseases: Secondary | ICD-10-CM | POA: Diagnosis not present

## 2019-06-14 ENCOUNTER — Encounter: Payer: Self-pay | Admitting: Nurse Practitioner

## 2019-06-14 ENCOUNTER — Ambulatory Visit (INDEPENDENT_AMBULATORY_CARE_PROVIDER_SITE_OTHER): Payer: BC Managed Care – PPO | Admitting: Nurse Practitioner

## 2019-06-14 DIAGNOSIS — K219 Gastro-esophageal reflux disease without esophagitis: Secondary | ICD-10-CM

## 2019-06-14 DIAGNOSIS — F411 Generalized anxiety disorder: Secondary | ICD-10-CM | POA: Diagnosis not present

## 2019-06-14 MED ORDER — ALPRAZOLAM 1 MG PO TABS: ORAL_TABLET | ORAL | 3 refills | Status: DC

## 2019-06-14 NOTE — Progress Notes (Signed)
Virtual Visit via telephone Note Due to COVID-19 pandemic this visit was conducted virtually. This visit type was conducted due to national recommendations for restrictions regarding the COVID-19 Pandemic (e.g. social distancing, sheltering in place) in an effort to limit this patient's exposure and mitigate transmission in our community. All issues noted in this document were discussed and addressed.  A physical exam was not performed with this format.  I connected with [name] on 06/14/19 at 10:15 by telephone and verified that I am speaking with the correct person using two identifiers. [name] is currently located at home and  No one is currently with her during visit. The provider, Mary-Margaret Hassell Done, FNP is located in their office at time of visit.  I discussed the limitations, risks, security and privacy concerns of performing an evaluation and management service by telephone and the availability of in person appointments. I also discussed with the patient that there may be a patient responsible charge related to this service. The patient expressed understanding and agreed to proceed.   History and Present Illness:   Chief Complaint: Medical Management of Chronic Issues    HPI:  1. GAD (generalized anxiety disorder) She has issues with anxiety for many years and has bbenon xanax for over 10- she takes 2-3 a day as needed. GAD 7 : Generalized Anxiety Score 06/14/2019 04/17/2018  Nervous, Anxious, on Edge 1 1  Control/stop worrying 1 1  Worry too much - different things 1 1  Trouble relaxing 0 1  Restless 0 1  Easily annoyed or irritable 0 0  Afraid - awful might happen 0 0  Total GAD 7 Score 3 5  Anxiety Difficulty Somewhat difficult Somewhat difficult      2. Gastroesophageal reflux disease without esophagitis Is on no rx meds and does not need any if she watches her diet    Outpatient Encounter Medications as of 06/14/2019  Medication Sig  .  ALPRAZolam (XANAX) 1 MG tablet TAKE 1/2 TO 1 TABLET 3 TIMES DAILY AS NEEDED  . fluticasone (FLONASE) 50 MCG/ACT nasal spray Place 2 sprays into both nostrils daily.    Past Surgical History:  Procedure Laterality Date  . CHOLECYSTECTOMY     2008    Family History  Problem Relation Age of Onset  . Diabetes Maternal Grandmother     New complaints: None today  Social history: Is a hair dresser  Controlled substance contract: will have signed at next face to face visit     Review of Systems  Constitutional: Negative for diaphoresis and weight loss.  Eyes: Negative for blurred vision, double vision and pain.  Respiratory: Negative for shortness of breath.   Cardiovascular: Negative for chest pain, palpitations, orthopnea and leg swelling.  Gastrointestinal: Negative for abdominal pain.  Skin: Negative for rash.  Neurological: Negative for dizziness, sensory change, loss of consciousness, weakness and headaches.  Endo/Heme/Allergies: Negative for polydipsia. Does not bruise/bleed easily.  Psychiatric/Behavioral: Negative for memory loss. The patient does not have insomnia.   All other systems reviewed and are negative.    Observations/Objective: Alert and oriented- answers all questions appropriately No distress    Assessment and Plan: [name] comes in today with chief complaint of Medical Management of Chronic Issues   Diagnosis and orders addressed:  1. GAD (generalized anxiety disorder) Stress management - ALPRAZolam (XANAX) 1 MG tablet; TAKE 1/2 TO 1 TABLET 3 TIMES DAILY AS NEEDED  Dispense: 90 tablet; Refill: 3  2. Gastroesophageal reflux disease  without esophagitis Avoid spicy foods Do not eat 2 hours prior to bedtime  - CMP14+EGFR; Future - CBC with Differential/Platelet; Future - Lipid panel; Future - Thyroid Panel With TSH; Future   She will come in for lab work in the next week Health Maintenance reviewed Diet and exercise encouraged   Follow up plan: prn     I discussed the assessment and treatment plan with the patient. The patient was provided an opportunity to ask questions and all were answered. The patient agreed with the plan and demonstrated an understanding of the instructions.   The patient was advised to call back or seek an in-person evaluation if the symptoms worsen or if the condition fails to improve as anticipated.  The above assessment and management plan was discussed with the patient. The patient verbalized understanding of and has agreed to the management plan. Patient is aware to call the clinic if symptoms persist or worsen. Patient is aware when to return to the clinic for a follow-up visit. Patient educated on when it is appropriate to go to the emergency department.   Time call ended:  10:25  I provided 10 minutes of non-face-to-face time during this encounter.    Mary-Margaret Hassell Done, FNP

## 2019-06-17 ENCOUNTER — Other Ambulatory Visit: Payer: BC Managed Care – PPO

## 2019-06-17 ENCOUNTER — Other Ambulatory Visit: Payer: Self-pay

## 2019-06-17 DIAGNOSIS — D2371 Other benign neoplasm of skin of right lower limb, including hip: Secondary | ICD-10-CM | POA: Diagnosis not present

## 2019-06-17 DIAGNOSIS — D2261 Melanocytic nevi of right upper limb, including shoulder: Secondary | ICD-10-CM | POA: Diagnosis not present

## 2019-06-17 DIAGNOSIS — Z85828 Personal history of other malignant neoplasm of skin: Secondary | ICD-10-CM | POA: Diagnosis not present

## 2019-06-17 DIAGNOSIS — F411 Generalized anxiety disorder: Secondary | ICD-10-CM | POA: Diagnosis not present

## 2019-06-17 DIAGNOSIS — K219 Gastro-esophageal reflux disease without esophagitis: Secondary | ICD-10-CM

## 2019-06-17 DIAGNOSIS — L819 Disorder of pigmentation, unspecified: Secondary | ICD-10-CM | POA: Diagnosis not present

## 2019-06-18 LAB — CBC WITH DIFFERENTIAL/PLATELET
Basophils Absolute: 0 10*3/uL (ref 0.0–0.2)
Basos: 0 %
EOS (ABSOLUTE): 0.1 10*3/uL (ref 0.0–0.4)
Eos: 1 %
Hematocrit: 41.6 % (ref 34.0–46.6)
Hemoglobin: 13.6 g/dL (ref 11.1–15.9)
Immature Grans (Abs): 0 10*3/uL (ref 0.0–0.1)
Immature Granulocytes: 0 %
Lymphocytes Absolute: 2 10*3/uL (ref 0.7–3.1)
Lymphs: 37 %
MCH: 29.6 pg (ref 26.6–33.0)
MCHC: 32.7 g/dL (ref 31.5–35.7)
MCV: 90 fL (ref 79–97)
Monocytes Absolute: 0.6 10*3/uL (ref 0.1–0.9)
Monocytes: 10 %
Neutrophils Absolute: 2.8 10*3/uL (ref 1.4–7.0)
Neutrophils: 52 %
Platelets: 287 10*3/uL (ref 150–450)
RBC: 4.6 x10E6/uL (ref 3.77–5.28)
RDW: 12.2 % (ref 11.7–15.4)
WBC: 5.5 10*3/uL (ref 3.4–10.8)

## 2019-06-18 LAB — CMP14+EGFR
ALT: 17 IU/L (ref 0–32)
AST: 16 IU/L (ref 0–40)
Albumin/Globulin Ratio: 2 (ref 1.2–2.2)
Albumin: 4.7 g/dL (ref 3.8–4.9)
Alkaline Phosphatase: 98 IU/L (ref 39–117)
BUN/Creatinine Ratio: 10 (ref 9–23)
BUN: 7 mg/dL (ref 6–24)
Bilirubin Total: 0.5 mg/dL (ref 0.0–1.2)
CO2: 27 mmol/L (ref 20–29)
Calcium: 9.8 mg/dL (ref 8.7–10.2)
Chloride: 103 mmol/L (ref 96–106)
Creatinine, Ser: 0.73 mg/dL (ref 0.57–1.00)
GFR calc Af Amer: 109 mL/min/{1.73_m2} (ref >59)
GFR calc non Af Amer: 94 mL/min/{1.73_m2} (ref >59)
Globulin, Total: 2.4 g/dL (ref 1.5–4.5)
Glucose: 78 mg/dL (ref 65–99)
Potassium: 4 mmol/L (ref 3.5–5.2)
Sodium: 145 mmol/L — ABNORMAL HIGH (ref 134–144)
Total Protein: 7.1 g/dL (ref 6.0–8.5)

## 2019-06-18 LAB — LIPID PANEL
Chol/HDL Ratio: 4.3 ratio (ref 0.0–4.4)
Cholesterol, Total: 200 mg/dL — ABNORMAL HIGH (ref 100–199)
HDL: 47 mg/dL (ref >39)
LDL Chol Calc (NIH): 138 mg/dL — ABNORMAL HIGH (ref 0–99)
Triglycerides: 83 mg/dL (ref 0–149)
VLDL Cholesterol Cal: 15 mg/dL (ref 5–40)

## 2019-06-18 LAB — THYROID PANEL WITH TSH
Free Thyroxine Index: 2 (ref 1.2–4.9)
T3 Uptake Ratio: 30 % (ref 24–39)
T4, Total: 6.6 ug/dL (ref 4.5–12.0)
TSH: 0.839 u[IU]/mL (ref 0.450–4.500)

## 2019-07-19 DIAGNOSIS — H2513 Age-related nuclear cataract, bilateral: Secondary | ICD-10-CM | POA: Diagnosis not present

## 2019-07-19 DIAGNOSIS — H5203 Hypermetropia, bilateral: Secondary | ICD-10-CM | POA: Diagnosis not present

## 2019-07-19 DIAGNOSIS — H52213 Irregular astigmatism, bilateral: Secondary | ICD-10-CM | POA: Diagnosis not present

## 2019-08-20 DIAGNOSIS — M545 Low back pain: Secondary | ICD-10-CM | POA: Diagnosis not present

## 2019-08-20 DIAGNOSIS — R300 Dysuria: Secondary | ICD-10-CM | POA: Diagnosis not present

## 2019-09-27 ENCOUNTER — Other Ambulatory Visit: Payer: Self-pay | Admitting: Obstetrics and Gynecology

## 2019-09-27 DIAGNOSIS — Z1231 Encounter for screening mammogram for malignant neoplasm of breast: Secondary | ICD-10-CM

## 2019-11-15 ENCOUNTER — Ambulatory Visit: Payer: BLUE CROSS/BLUE SHIELD

## 2019-12-13 ENCOUNTER — Other Ambulatory Visit: Payer: Self-pay | Admitting: Nurse Practitioner

## 2019-12-13 DIAGNOSIS — F411 Generalized anxiety disorder: Secondary | ICD-10-CM

## 2019-12-20 ENCOUNTER — Ambulatory Visit: Payer: Self-pay

## 2020-05-18 ENCOUNTER — Other Ambulatory Visit: Payer: Self-pay

## 2020-05-18 ENCOUNTER — Encounter: Payer: Self-pay | Admitting: Nurse Practitioner

## 2020-05-18 ENCOUNTER — Ambulatory Visit (INDEPENDENT_AMBULATORY_CARE_PROVIDER_SITE_OTHER): Payer: BC Managed Care – PPO | Admitting: Nurse Practitioner

## 2020-05-18 VITALS — BP 126/84 | HR 80 | Temp 98.6°F | Ht 69.0 in | Wt 138.0 lb

## 2020-05-18 DIAGNOSIS — F5101 Primary insomnia: Secondary | ICD-10-CM | POA: Diagnosis not present

## 2020-05-18 DIAGNOSIS — Z1211 Encounter for screening for malignant neoplasm of colon: Secondary | ICD-10-CM

## 2020-05-18 DIAGNOSIS — Z1212 Encounter for screening for malignant neoplasm of rectum: Secondary | ICD-10-CM

## 2020-05-18 DIAGNOSIS — F411 Generalized anxiety disorder: Secondary | ICD-10-CM | POA: Diagnosis not present

## 2020-05-18 DIAGNOSIS — Z136 Encounter for screening for cardiovascular disorders: Secondary | ICD-10-CM | POA: Diagnosis not present

## 2020-05-18 DIAGNOSIS — K219 Gastro-esophageal reflux disease without esophagitis: Secondary | ICD-10-CM | POA: Diagnosis not present

## 2020-05-18 DIAGNOSIS — Z1159 Encounter for screening for other viral diseases: Secondary | ICD-10-CM | POA: Diagnosis not present

## 2020-05-18 DIAGNOSIS — Z0000 Encounter for general adult medical examination without abnormal findings: Secondary | ICD-10-CM | POA: Diagnosis not present

## 2020-05-18 MED ORDER — ALPRAZOLAM 1 MG PO TABS: ORAL_TABLET | ORAL | 3 refills | Status: DC

## 2020-05-18 MED ORDER — DOXEPIN HCL 6 MG PO TABS: 1.0000 | ORAL_TABLET | Freq: Every day | ORAL | 2 refills | Status: DC

## 2020-05-18 NOTE — Patient Instructions (Signed)

## 2020-05-18 NOTE — Progress Notes (Signed)
Subjective:    Patient ID: [name], female    DOB: 1965-12-18, 54 y.o.   MRN: [id number]   Chief Complaint: medical management of chronic issues     HPI:  1. Gastroesophageal reflux disease without esophagitis Doing well is on no daily medication. Only takes OTC meds as needed.  2. GAD (generalized anxiety disorder) Is on xanax 76m TID. Is under a lot of stress trying to help take care of her ailing parents. GAD 7 : Generalized Anxiety Score 05/18/2020 05/18/2020 06/14/2019 04/17/2018  Nervous, Anxious, on Edge 0 - 1 1  Control/stop worrying 0 - 1 1  Worry too much - different things 0 - 1 1  Trouble relaxing 0 - 0 1  Restless 0 - 0 1  Easily annoyed or irritable 0 - 0 0  Afraid - awful might happen 0 0 0 0  Total GAD 7 Score 0 - 3 5  Anxiety Difficulty Not difficult at all - Somewhat difficult Somewhat difficult        Outpatient Encounter Medications as of 05/18/2020  Medication Sig   ALPRAZolam (XANAX) 1 MG tablet TAKE 1/2 TO 1 TABLET 3 TIMES DAILY AS NEEDED   fluticasone (FLONASE) 50 MCG/ACT nasal spray Place 2 sprays into both nostrils daily.     Past Surgical History:  Procedure Laterality Date   CHOLECYSTECTOMY     2008    Family History  Problem Relation Age of Onset   Diabetes Maternal Grandmother     New complaints: haing troubke sleeping and does not want to take xanax to sleep.  Social history: Lives with her husband and has a son that just graduated from college.  Controlled substance contract: 05/18/20    Review of Systems  Constitutional: Negative for diaphoresis.  Eyes: Negative for pain.  Respiratory: Negative for shortness of breath.   Cardiovascular: Negative for chest pain, palpitations and leg swelling.  Gastrointestinal: Negative for abdominal pain.  Endocrine: Negative for polydipsia.  Skin: Negative for rash.  Neurological: Negative for dizziness, weakness and headaches.  Hematological: Does not bruise/bleed easily.    All other systems reviewed and are negative.      Objective:   Physical Exam Vitals and nursing note reviewed.  Constitutional:      General: She is not in acute distress.    Appearance: Normal appearance. She is well-developed.  HENT:     Head: Normocephalic.     Nose: Nose normal.  Eyes:     Pupils: Pupils are equal, round, and reactive to light.  Neck:     Vascular: No carotid bruit or JVD.  Cardiovascular:     Rate and Rhythm: Normal rate and regular rhythm.     Heart sounds: Normal heart sounds.  Pulmonary:     Effort: Pulmonary effort is normal. No respiratory distress.     Breath sounds: Normal breath sounds. No wheezing or rales.  Chest:     Chest wall: No tenderness.  Abdominal:     General: Bowel sounds are normal. There is no distension or abdominal bruit.     Palpations: Abdomen is soft. There is no hepatomegaly, splenomegaly, mass or pulsatile mass.     Tenderness: There is no abdominal tenderness.  Musculoskeletal:        General: Normal range of motion.     Cervical back: Normal range of motion and neck supple.  Lymphadenopathy:     Cervical: No cervical adenopathy.  Skin:    General: Skin is warm and  dry.  Neurological:     Mental Status: She is alert and oriented to person, place, and time.     Deep Tendon Reflexes: Reflexes are normal and symmetric.  Psychiatric:        Behavior: Behavior normal.        Thought Content: Thought content normal.        Judgment: Judgment normal.     BP 126/84    Pulse 80    Temp 98.6 F (37 C) (Oral)    Ht _0  (1.753 m)    Wt 138 lb (62.6 kg)    LMP 04/24/2015    BMI 20.38 kg/m         Assessment & Plan:  [name] comes in today with chief complaint of Medical Management of Chronic Issues   Diagnosis and orders addressed:  1. Gastroesophageal reflux disease without esophagitis Avoid spicy foods Do not eat 2 hours prior to bedtime - CMP14+EGFR - CBC with Differential/Platelet - Lipid  panel  2. GAD (generalized anxiety disorder) Stress management - ALPRAZolam (XANAX) 1 MG tablet; TAKE 1/2 TO 1 TABLET 3 TIMES DAILY AS NEEDED  Dispense: 90 tablet; Refill: 3  3. Primary insomnia Bedtime rouitne - Doxepin HCl 6 MG TABS; Take 1 tablet (6 mg total) by mouth at bedtime.  Dispense: 30 tablet; Refill: 2   Labs pending Health Maintenance reviewed Diet and exercise encouraged  Follow up plan: 6 month   [place], FNP

## 2020-05-19 LAB — CMP14+EGFR
ALT: 21 IU/L (ref 0–32)
AST: 20 IU/L (ref 0–40)
Albumin/Globulin Ratio: 1.8 (ref 1.2–2.2)
Albumin: 4.6 g/dL (ref 3.8–4.9)
Alkaline Phosphatase: 97 IU/L (ref 48–121)
BUN/Creatinine Ratio: 16 (ref 9–23)
BUN: 10 mg/dL (ref 6–24)
Bilirubin Total: 0.4 mg/dL (ref 0.0–1.2)
CO2: 28 mmol/L (ref 20–29)
Calcium: 9.5 mg/dL (ref 8.7–10.2)
Chloride: 102 mmol/L (ref 96–106)
Creatinine, Ser: 0.64 mg/dL (ref 0.57–1.00)
GFR calc Af Amer: 118 mL/min/{1.73_m2} (ref >59)
GFR calc non Af Amer: 102 mL/min/{1.73_m2} (ref >59)
Globulin, Total: 2.5 g/dL (ref 1.5–4.5)
Glucose: 80 mg/dL (ref 65–99)
Potassium: 4.8 mmol/L (ref 3.5–5.2)
Sodium: 141 mmol/L (ref 134–144)
Total Protein: 7.1 g/dL (ref 6.0–8.5)

## 2020-05-19 LAB — CBC WITH DIFFERENTIAL/PLATELET
Basophils Absolute: 0 10*3/uL (ref 0.0–0.2)
Basos: 0 %
EOS (ABSOLUTE): 0.1 10*3/uL (ref 0.0–0.4)
Eos: 1 %
Hematocrit: 41.5 % (ref 34.0–46.6)
Hemoglobin: 13.8 g/dL (ref 11.1–15.9)
Immature Grans (Abs): 0 10*3/uL (ref 0.0–0.1)
Immature Granulocytes: 0 %
Lymphocytes Absolute: 2.1 10*3/uL (ref 0.7–3.1)
Lymphs: 42 %
MCH: 29.7 pg (ref 26.6–33.0)
MCHC: 33.3 g/dL (ref 31.5–35.7)
MCV: 89 fL (ref 79–97)
Monocytes Absolute: 0.5 10*3/uL (ref 0.1–0.9)
Monocytes: 10 %
Neutrophils Absolute: 2.4 10*3/uL (ref 1.4–7.0)
Neutrophils: 47 %
Platelets: 259 10*3/uL (ref 150–450)
RBC: 4.64 x10E6/uL (ref 3.77–5.28)
RDW: 12.5 % (ref 11.7–15.4)
WBC: 5.1 10*3/uL (ref 3.4–10.8)

## 2020-05-19 LAB — LIPID PANEL
Chol/HDL Ratio: 4.4 ratio (ref 0.0–4.4)
Cholesterol, Total: 209 mg/dL — ABNORMAL HIGH (ref 100–199)
HDL: 47 mg/dL (ref >39)
LDL Chol Calc (NIH): 144 mg/dL — ABNORMAL HIGH (ref 0–99)
Triglycerides: 99 mg/dL (ref 0–149)
VLDL Cholesterol Cal: 18 mg/dL (ref 5–40)

## 2020-05-19 LAB — HEPATITIS C ANTIBODY: Hep C Virus Ab: <0.1 s/co ratio (ref 0.0–0.9)

## 2020-05-25 ENCOUNTER — Encounter: Payer: Self-pay | Admitting: Internal Medicine

## 2020-05-25 ENCOUNTER — Telehealth: Payer: Self-pay | Admitting: Internal Medicine

## 2020-05-25 NOTE — Telephone Encounter (Signed)
Happy to accept her as a patient

## 2020-05-25 NOTE — Telephone Encounter (Signed)
Patient scheduled on 11/5.

## 2020-07-08 DIAGNOSIS — H6123 Impacted cerumen, bilateral: Secondary | ICD-10-CM | POA: Diagnosis not present

## 2020-07-08 DIAGNOSIS — H9202 Otalgia, left ear: Secondary | ICD-10-CM | POA: Diagnosis not present

## 2020-07-14 ENCOUNTER — Encounter: Payer: Self-pay | Admitting: Internal Medicine

## 2020-07-14 ENCOUNTER — Ambulatory Visit (AMBULATORY_SURGERY_CENTER): Payer: Self-pay

## 2020-07-14 ENCOUNTER — Other Ambulatory Visit: Payer: Self-pay

## 2020-07-14 VITALS — Ht 69.0 in | Wt 139.0 lb

## 2020-07-14 DIAGNOSIS — Z1211 Encounter for screening for malignant neoplasm of colon: Secondary | ICD-10-CM

## 2020-07-14 MED ORDER — NA SULFATE-K SULFATE-MG SULF 17.5-3.13-1.6 GM/177ML PO SOLN: 1.0000 | Freq: Once | ORAL | 0 refills | Status: AC

## 2020-07-14 NOTE — Progress Notes (Signed)
No egg or soy allergy known to patient  No issues with past sedation with any surgeries or procedures no intubation problems in the past  No FH of Malignant Hyperthermia No diet pills per patient No home 02 use per patient  No blood thinners per patient  Pt denies issues with constipation  No A fib or A flutter  EMMI video via [place] 19 guidelines implemented in PV today with Pt and RN  Coupon given to pt in PV today , Code to Pharmacy  COVID vaccines completed 02/2020 per pt;  Due to the COVID-19 pandemic we are asking patients to follow these guidelines. Please only bring one care partner. Please be aware that your care partner may wait in the car in the parking lot or if they feel like they will be too hot to wait in the car, they may wait in the lobby on the 4th floor. All care partners are required to wear a mask the entire time (we do not have any that we can provide them), they need to practice social distancing, and we will do a Covid check for all patient's and care partners when you arrive. Also we will check their temperature and your temperature. If the care partner waits in their car they need to stay in the parking lot the entire time and we will call them on their cell phone when the patient is ready for discharge so they can bring the car to the front of the building. Also all patient's will need to wear a mask into building.

## 2020-07-17 ENCOUNTER — Telehealth: Payer: Self-pay | Admitting: Internal Medicine

## 2020-07-17 DIAGNOSIS — R059 Cough, unspecified: Secondary | ICD-10-CM | POA: Diagnosis not present

## 2020-07-17 DIAGNOSIS — J069 Acute upper respiratory infection, unspecified: Secondary | ICD-10-CM | POA: Diagnosis not present

## 2020-07-17 DIAGNOSIS — Z20822 Contact with and (suspected) exposure to covid-19: Secondary | ICD-10-CM | POA: Diagnosis not present

## 2020-07-17 DIAGNOSIS — Z1211 Encounter for screening for malignant neoplasm of colon: Secondary | ICD-10-CM

## 2020-07-17 MED ORDER — SUTAB 1479-225-188 MG PO TABS: 24.0000 | ORAL_TABLET | ORAL | 0 refills | Status: DC

## 2020-07-17 NOTE — Telephone Encounter (Signed)
Pt notified of prep and instructions to pharmacy

## 2020-07-17 NOTE — Telephone Encounter (Signed)
Pt states she got the approval from her insurance to do the SUTAB prep instead of the liquids, pt would like to know if she could get this switched over.

## 2020-07-17 NOTE — Telephone Encounter (Signed)
Sent in script with sutab code to [place] in sutab instructions via my chart   CAlled pt 4 times, no answer- MB full cannot LM

## 2020-07-28 ENCOUNTER — Encounter: Payer: Self-pay | Admitting: Internal Medicine

## 2020-07-28 ENCOUNTER — Other Ambulatory Visit: Payer: Self-pay

## 2020-07-28 ENCOUNTER — Ambulatory Visit (AMBULATORY_SURGERY_CENTER): Payer: BC Managed Care – PPO | Admitting: Internal Medicine

## 2020-07-28 VITALS — BP 137/79 | HR 63 | Temp 96.8°F | Resp 12 | Ht 69.0 in | Wt 139.0 lb

## 2020-07-28 DIAGNOSIS — K5140 Inflammatory polyps of colon without complications: Secondary | ICD-10-CM

## 2020-07-28 DIAGNOSIS — D123 Benign neoplasm of transverse colon: Secondary | ICD-10-CM

## 2020-07-28 DIAGNOSIS — Z1211 Encounter for screening for malignant neoplasm of colon: Secondary | ICD-10-CM | POA: Diagnosis not present

## 2020-07-28 MED ORDER — SODIUM CHLORIDE 0.9 % IV SOLN: 500.0000 mL | Freq: Once | INTRAVENOUS | Status: DC

## 2020-07-28 NOTE — Progress Notes (Signed)
A/ox3, pleased with MAC, report to RN 

## 2020-07-28 NOTE — Patient Instructions (Addendum)
I found and removed one very tiny polyp.  I am certain its benign we will get it analyzed to see if it is precancerous or not and I will let you know those results and when to have a routine repeat colonoscopy.  All else looks normal.  I appreciate the opportunity to care for you. Gatha Mayer, MD, [facility]  Polyp handout given to patient.  YOU HAD AN ENDOSCOPIC PROCEDURE TODAY AT [facility]:   Refer to the procedure report that was given to you for any specific questions about what was found during the examination.  If the procedure report does not answer your questions, please call your gastroenterologist to clarify.  If you requested that your care partner not be given the details of your procedure findings, then the procedure report has been included in a sealed envelope for you to review at your convenience later.  YOU SHOULD EXPECT: Some feelings of bloating in the abdomen. Passage of more gas than usual.  Walking can help get rid of the air that was put into your GI tract during the procedure and reduce the bloating. If you had a lower endoscopy (such as a colonoscopy or flexible sigmoidoscopy) you may notice spotting of blood in your stool or on the toilet paper. If you underwent a bowel prep for your procedure, you may not have a normal bowel movement for a few days.  Please Note:  You might notice some irritation and congestion in your nose or some drainage.  This is from the oxygen used during your procedure.  There is no need for concern and it should clear up in a day or so.  SYMPTOMS TO REPORT IMMEDIATELY:   Following lower endoscopy (colonoscopy or flexible sigmoidoscopy):  Excessive amounts of blood in the stool  Significant tenderness or worsening of abdominal pains  Swelling of the abdomen that is new, acute  Fever of 100F or higher For urgent or emergent issues, a gastroenterologist can be reached at any hour by calling [phone]. Do not use MyChart  messaging for urgent concerns.    DIET:  We do recommend a small meal at first, but then you may proceed to your regular diet.  Drink plenty of fluids but you should avoid alcoholic beverages for 24 hours.  ACTIVITY:  You should plan to take it easy for the rest of today and you should NOT DRIVE or use heavy machinery until tomorrow (because of the sedation medicines used during the test).    FOLLOW UP: Our staff will call the number listed on your records 48-72 hours following your procedure to check on you and address any questions or concerns that you may have regarding the information given to you following your procedure. If we do not reach you, we will leave a message.  We will attempt to reach you two times.  During this call, we will ask if you have developed any symptoms of COVID 19. If you develop any symptoms (ie: fever, flu-like symptoms, shortness of breath, cough etc.) before then, please call [phone].  If you test positive for Covid 19 in the 2 weeks post procedure, please call and report this information to [place].    If any biopsies were taken you will be contacted by phone or by letter within the next 1-3 weeks.  Please call us at [phone] if you have not heard about the biopsies in 3 weeks.    SIGNATURES/CONFIDENTIALITY: You and/or your care partner have signed  paperwork which will be entered into your electronic medical record.  These signatures attest to the fact that that the information above on your After Visit Summary has been reviewed and is understood.  Full responsibility of the confidentiality of this discharge information lies with you and/or your care-partner.

## 2020-07-28 NOTE — Progress Notes (Signed)
Called to room to assist during endoscopic procedure.  Patient ID and intended procedure confirmed with present staff. Received instructions for my participation in the procedure from the performing physician.  

## 2020-07-28 NOTE — Op Note (Signed)
[facility] Patient Name: [name] Procedure Date: 07/28/2020 8:47 AM MRN: [id number] Endoscopist: Gatha Mayer , MD Age: 54 Referring MD:  Date of Birth: Oct 30, 1965 Gender: Female Account #: [id number] Procedure:                Colonoscopy Indications:              Screening for colorectal malignant neoplasm Medicines:                Propofol per Anesthesia, Monitored Anesthesia Care Procedure:                Pre-Anesthesia Assessment:                           - Prior to the procedure, a History and Physical                            was performed, and patient medications and                            allergies were reviewed. The patient's tolerance of                            previous anesthesia was also reviewed. The risks                            and benefits of the procedure and the sedation                            options and risks were discussed with the patient.                            All questions were answered, and informed consent                            was obtained. Prior Anticoagulants: The patient has                            taken no previous anticoagulant or antiplatelet                            agents. ASA Grade Assessment: II - A patient with                            mild systemic disease. After reviewing the risks                            and benefits, the patient was deemed in                            satisfactory condition to undergo the procedure.                           After obtaining informed consent, the colonoscope  was passed under direct vision. Throughout the                            procedure, the patient's blood pressure, pulse, and                            oxygen saturations were monitored continuously. The                            Colonoscope was introduced through the anus and                            advanced to the the cecum, identified by                             appendiceal orifice and ileocecal valve. The                            colonoscopy was performed without difficulty. The                            patient tolerated the procedure well. The quality                            of the bowel preparation was excellent. The                            ileocecal valve, appendiceal orifice, and rectum                            were photographed. The bowel preparation used was                            SuTab via split dose instruction. Scope In: 9:00:48 AM Scope Out: 9:15:56 AM Scope Withdrawal Time: 0 hours 10 minutes 58 seconds  Total Procedure Duration: 0 hours 15 minutes 8 seconds  Findings:                 The perianal and digital rectal examinations were                            normal.                           A 2 to 3 mm polyp was found in the transverse                            colon. The polyp was sessile. The polyp was removed                            with a cold snare. Resection and retrieval were                            complete. Verification of patient identification  for the specimen was done. Estimated blood loss was                            minimal.                           The exam was otherwise without abnormality on                            direct and retroflexion views. Complications:            No immediate complications. Estimated Blood Loss:     Estimated blood loss was minimal. Impression:               - One 2 to 3 mm polyp in the transverse colon,                            removed with a cold snare. Resected and retrieved.                           - The examination was otherwise normal on direct                            and retroflexion views. Recommendation:           - Patient has a contact number available for                            emergencies. The signs and symptoms of potential                            delayed complications were discussed with the                             patient. Return to normal activities tomorrow.                            Written discharge instructions were provided to the                            patient.                           - Resume previous diet.                           - Continue present medications.                           - Repeat colonoscopy is recommended. The                            colonoscopy date will be determined after pathology                            results from today's exam become available for  review. Gatha Mayer, MD 07/28/2020 9:28:09 AM This report has been signed electronically.

## 2020-08-01 ENCOUNTER — Telehealth: Payer: Self-pay | Admitting: *Deleted

## 2020-08-01 NOTE — Telephone Encounter (Signed)
  Follow up Call-  Call back number 07/28/2020  Post procedure Call Back phone  # [phone]  Permission to leave phone message Yes  Some recent data might be hidden     Patient questions:  Do you have a fever, pain , or abdominal swelling? No. Pain Score  0 *  Have you tolerated food without any problems? Yes.    Have you been able to return to your normal activities? Yes.    Do you have any questions about your discharge instructions: Diet   No. Medications  No. Follow up visit  No.  Do you have questions or concerns about your Care? No.  Actions: * If pain score is 4 or above: No action needed, pain <4.  1. Have you developed a fever since your procedure? no  2.   Have you had an respiratory symptoms (SOB or cough) since your procedure? no  3.   Have you tested positive for COVID 19 since your procedure no  4.   Have you had any family members/close contacts diagnosed with the COVID 19 since your procedure?  no   If yes to any of these questions please route to Joylene John, RN and Joella Prince, RN

## 2020-08-10 ENCOUNTER — Encounter: Payer: Self-pay | Admitting: Internal Medicine

## 2020-08-28 DIAGNOSIS — M79671 Pain in right foot: Secondary | ICD-10-CM | POA: Diagnosis not present

## 2020-09-12 ENCOUNTER — Ambulatory Visit: Payer: Self-pay | Admitting: Family Medicine

## 2020-09-21 DIAGNOSIS — Z85828 Personal history of other malignant neoplasm of skin: Secondary | ICD-10-CM | POA: Diagnosis not present

## 2020-09-21 DIAGNOSIS — L820 Inflamed seborrheic keratosis: Secondary | ICD-10-CM | POA: Diagnosis not present

## 2020-09-21 DIAGNOSIS — L821 Other seborrheic keratosis: Secondary | ICD-10-CM | POA: Diagnosis not present

## 2020-09-21 DIAGNOSIS — D2371 Other benign neoplasm of skin of right lower limb, including hip: Secondary | ICD-10-CM | POA: Diagnosis not present

## 2020-09-21 DIAGNOSIS — D1801 Hemangioma of skin and subcutaneous tissue: Secondary | ICD-10-CM | POA: Diagnosis not present

## 2020-10-12 DIAGNOSIS — H6123 Impacted cerumen, bilateral: Secondary | ICD-10-CM | POA: Diagnosis not present

## 2020-10-12 DIAGNOSIS — E042 Nontoxic multinodular goiter: Secondary | ICD-10-CM | POA: Insufficient documentation

## 2020-10-12 DIAGNOSIS — E041 Nontoxic single thyroid nodule: Secondary | ICD-10-CM | POA: Diagnosis not present

## 2020-10-13 ENCOUNTER — Other Ambulatory Visit: Payer: Self-pay | Admitting: Physician Assistant

## 2020-10-13 DIAGNOSIS — E041 Nontoxic single thyroid nodule: Secondary | ICD-10-CM

## 2020-10-25 ENCOUNTER — Encounter: Payer: Self-pay | Admitting: Family Medicine

## 2020-10-25 ENCOUNTER — Ambulatory Visit (INDEPENDENT_AMBULATORY_CARE_PROVIDER_SITE_OTHER): Payer: BC Managed Care – PPO | Admitting: Family Medicine

## 2020-10-25 ENCOUNTER — Other Ambulatory Visit: Payer: Self-pay

## 2020-10-25 VITALS — BP 120/84 | HR 71 | Temp 98.0°F | Resp 16 | Ht 66.0 in | Wt 136.2 lb

## 2020-10-25 DIAGNOSIS — Z79899 Other long term (current) drug therapy: Secondary | ICD-10-CM | POA: Insufficient documentation

## 2020-10-25 DIAGNOSIS — Z23 Encounter for immunization: Secondary | ICD-10-CM

## 2020-10-25 DIAGNOSIS — K219 Gastro-esophageal reflux disease without esophagitis: Secondary | ICD-10-CM | POA: Diagnosis not present

## 2020-10-25 DIAGNOSIS — G4709 Other insomnia: Secondary | ICD-10-CM | POA: Diagnosis not present

## 2020-10-25 DIAGNOSIS — E041 Nontoxic single thyroid nodule: Secondary | ICD-10-CM

## 2020-10-25 DIAGNOSIS — K635 Polyp of colon: Secondary | ICD-10-CM

## 2020-10-25 DIAGNOSIS — Z1231 Encounter for screening mammogram for malignant neoplasm of breast: Secondary | ICD-10-CM

## 2020-10-25 HISTORY — DX: Polyp of colon: K63.5

## 2020-10-25 MED ORDER — TRAZODONE HCL 50 MG PO TABS
25.0000 mg | ORAL_TABLET | Freq: Every evening | ORAL | 3 refills | Status: DC | PRN
Start: 2020-10-25 — End: 2021-07-04

## 2020-10-25 NOTE — Progress Notes (Signed)
Subjective  CC:  Chief Complaint  Patient presents with  . New Patient (Initial Visit)    Previously seen at [facility], seen by ENT 10/12/20 - nodule on thyroid was discovered   . Health Maintenance    Flu shot given in office today     HPI: [name] is a 55 y.o. female who presents to [facility] [place] today to establish care with me as a new patient.   She has the following concerns or needs:  Very pleasant married 55 year old postmenopausal female with grown son presents to establish care.  I reviewed records from prior PCP office, lab work, recent ENT evaluation, gastroenterology and colonoscopies.  Also reviewed Pap smears.  She sees gynecology annually.  Due for that physical now.  Due for mammogram.  Other health maintenance is up-to-date.  She is eligible for Shingrix.  Due for flu shot today.  Overall, very healthy.  Eats well.  Exercises.  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  Her main problem over the last few years is insomnia.  This is likely secondary and related to stressors of business ownership in setting of a Covid pandemic.  Also menopausal symptoms are playing a role.  She has trouble winding down at night.  She works long hours.  She has been using Xanax for sleep.  At this point she is using most nights of the week.  She denies history of anxiety.  No history of mood problems.  No adverse effects.  She is concerned about developing tolerance.  She rarely drinks alcohol.  She admits to being on her phone or computer late at night.  I reviewed drug database.  Is appropriate.  Colon cancer screening was done last year.  She had a benign polyp.  Recall in 10 years.  No family history.  Recent ENT consultation reviewed.  Incidental palpable right thyroid nodules noted.  She has an ultrasound scheduled.  She has questions regarding this.  TSH has been normal in the past.  No signs or symptoms of thyroid problems.  History of GERD.   Rarely active now.  Assessment  1. Secondary insomnia   2. Gastroesophageal reflux disease without esophagitis   3. Chronic prescription benzodiazepine use   4. Benign colon polyp   5. Thyroid nodule   6. Encounter for screening mammogram for breast cancer   7. Need for immunization against influenza      Plan   Secondary insomnia: Counseling education given.  Recommend weaning Xanax.  Trial trazodone initiated.  Discussed behavioral management to help sleep.  See after visit summary.  Follow-up if not proving.  GERD: Intermittently symptomatic.  Occasional PPI use.  Chronic benzo use: Discussed weaning off of this to prevent future problems.  Benign colon polyp discussed.  Palpable thyroid nodule: We will follow-up on thyroid ultrasound.  May warrant further imaging or biopsy.  All questions answered.  Health maintenance: Mammogram ordered.  She is to schedule with GYN soon.  Flu shot given today.  Recommend Shingrix, she will check on coverage.  Follow up:  Return in about 6 months (around 04/24/2021) for complete physical. Orders Placed This Encounter  Procedures  . MM DIGITAL SCREENING BILATERAL  . Flu Vaccine QUAD 36+ mos IM   Meds ordered this encounter  Medications  . traZODone (DESYREL) 50 MG tablet    Sig: Take 0.5-1 tablets (25-50 mg total) by mouth at bedtime as needed for sleep.    Dispense:  30 tablet    Refill:  3     Depression screen [facility] 2/9 10/25/2020 05/18/2020 10/12/2018 04/17/2018 12/04/2017  Decreased Interest 0 0 0 0 0  Down, Depressed, Hopeless 0 0 0 0 0  PHQ - 2 Score 0 0 0 0 0  Altered sleeping - 0 - - -  Tired, decreased energy - 0 - - -  Change in appetite - 0 - - -  Feeling bad or failure about yourself  - 0 - - -  Trouble concentrating - 0 - - -  Moving slowly or fidgety/restless - 0 - - -  Suicidal thoughts - 0 - - -  PHQ-9 Score - 0 - - -  Difficult doing work/chores - Not difficult at all - - -    We updated and reviewed the patient's  past history in detail and it is documented below.  Patient Active Problem List   Diagnosis Date Noted  . Chronic prescription benzodiazepine use 10/25/2020  . Benign colon polyp 10/25/2020    07/2020, Dr. Carlean Purl; q 10 yr recall   . Secondary insomnia 10/25/2020  . Thyroid nodule 10/12/2020  . GERD (gastroesophageal reflux disease) 07/28/2008   Health Maintenance  Topic Date Due  . MAMMOGRAM  10/20/2019  . COVID-19 Vaccine (3 - Booster for Moderna series) 09/12/2020  . HIV Screening  05/18/2021 (Originally 06/10/1981)  . PAP SMEAR-Modifier  01/12/2021  . TETANUS/TDAP  10/04/2027  . COLONOSCOPY (Pts 45-42yrs Insurance coverage will need to be confirmed)  07/28/2030  . INFLUENZA VACCINE  Completed  . Hepatitis C Screening  Completed   Immunization History  Administered Date(s) Administered  . Influenza,inj,Quad PF,6+ Mos 10/12/2018, 10/25/2020  . Moderna Sars-Covid-2 Vaccination 02/14/2020, 03/13/2020  . Tdap 10/03/2017   Current Meds  Medication Sig  . traZODone (DESYREL) 50 MG tablet Take 0.5-1 tablets (25-50 mg total) by mouth at bedtime as needed for sleep.  . [DISCONTINUED] ALPRAZolam (XANAX) 1 MG tablet TAKE 1/2 TO 1 TABLET 3 TIMES DAILY AS NEEDED  . [DISCONTINUED] Doxepin HCl 6 MG TABS Take 1 tablet (6 mg total) by mouth at bedtime.  . [DISCONTINUED] fluticasone (FLONASE) 50 MCG/ACT nasal spray Place 2 sprays into both nostrils daily.    Allergies: Patient has No Known Allergies. Past Medical History Patient  has a past medical history of Allergy, Benign colon polyp (10/25/2020), Esophageal reflux, Frequent UTI, and Heart murmur. Past Surgical History Patient  has a past surgical history that includes Cholecystectomy; Wisdom tooth extraction; and Dilation and curettage of uterus. Family History: Patient family history includes Arthritis in her mother; Dementia in her paternal grandmother; Diabetes in her maternal grandmother and mother; Healthy in her sister and son;  Heart disease in her maternal grandfather; Hypertension in her father and mother; Kidney disease in her maternal grandmother. Social History:  Patient  reports that she has never smoked. She has never used smokeless tobacco. She reports current alcohol use of about 2.0 standard drinks of alcohol per week. She reports that she does not use drugs.  Review of Systems: Constitutional: negative for fever or malaise Ophthalmic: negative for photophobia, double vision or loss of vision Cardiovascular: negative for chest pain, dyspnea on exertion, or new LE swelling Respiratory: negative for SOB or persistent cough Gastrointestinal: negative for abdominal pain, change in bowel habits or melena Genitourinary: negative for dysuria or gross hematuria Musculoskeletal: negative for new gait disturbance or muscular weakness Integumentary: negative for new or persistent rashes Neurological: negative for TIA or stroke symptoms Psychiatric:  negative for SI or delusions Allergic/Immunologic: negative for hives  Patient Care Team    Relationship Specialty Notifications Start End  Leamon Arnt, MD PCP - General Family Medicine  10/25/20   Newton Pigg, MD Consulting Physician Obstetrics and Gynecology  10/25/20   Gatha Mayer, MD Consulting Physician Gastroenterology  10/25/20   Marilynne Halsted, MD Consulting Physician Ophthalmology  10/25/20   Jolene Provost, PA-C Physician Assistant Otolaryngology  10/25/20     Objective  Vitals: BP 120/84   Pulse 71   Temp 98 F (36.7 C) (Temporal)   Resp 16   Ht 5\' 6"  (1.676 m)   Wt 136 lb 3.2 oz (61.8 kg)   LMP 04/24/2015   SpO2 98%   BMI 21.98 kg/m  General:  Well developed, well nourished, no acute distress  Psych:  Alert and oriented,normal mood and affect HEENT:  Normocephalic, atraumatic, non-icteric sclera, supple neck without adenopathy, mass or thyromegaly, right approximately centimeter nontender palpable nodule of the thyroid  noted Cardiovascular:  RRR without gallop, rub or murmur, no edema Respiratory:  Good breath sounds bilaterally, CTAB with normal respiratory effort Skin:  Warm, no rashes or suspicious lesions noted Neurologic:    Mental status is normal. Gross motor and sensory exams are normal. Normal gait   Commons side effects, risks, benefits, and alternatives for medications and treatment plan prescribed today were discussed, and the patient expressed understanding of the given instructions. Patient is instructed to call or message via MyChart if he/she has any questions or concerns regarding our treatment plan. No barriers to understanding were identified. We discussed Red Flag symptoms and signs in detail. Patient expressed understanding regarding what to do in case of urgent or emergency type symptoms.   Medication list was reconciled, printed and provided to the patient in AVS. Patient instructions and summary information was reviewed with the patient as documented in the AVS. This note was prepared with assistance of Dragon voice recognition software. Occasional wrong-word or sound-a-like substitutions may have occurred due to the inherent limitations of voice recognition software  This visit occurred during the SARS-CoV-2 public health emergency.  Safety protocols were in place, including screening questions prior to the visit, additional usage of staff PPE, and extensive cleaning of exam room while observing appropriate contact time as indicated for disinfecting solutions.

## 2020-10-25 NOTE — Patient Instructions (Addendum)
Please return in august 2022 for your annual complete physical; please come fasting. Sooner if you need more help with sleep.   Please wean off the xanax and start the trazodone. You may overlap these medications.   Please check your coverage on the shingrix vaccine; if covered, call to set up a nurse visit to get the vaccine.  Today you were given your Flu vaccination.   It was a pleasure meeting you today! Thank you for choosing [place] to meet your healthcare needs! I truly look forward to working with you. If you have any questions or concerns, please send me a message via Mychart or call the office at [phone].  I have ordered a mammogram and/or bone density for you as we discussed today: [x]   Mammogram  []   Bone Density  Please call the office checked below to schedule your appointment: Your appointment will at the following location  [x]   [facility]      [place], [place]         Insomnia Insomnia is a sleep disorder that makes it difficult to fall asleep or stay asleep. Insomnia can cause fatigue, low energy, difficulty concentrating, mood swings, and poor performance at work or school. There are three different ways to classify insomnia:  Difficulty falling asleep.  Difficulty staying asleep.  Waking up too early in the morning. Any type of insomnia can be long-term (chronic) or short-term (acute). Both are common. Short-term insomnia usually lasts for three months or less. Chronic insomnia occurs at least three times a week for longer than three months. What are the causes? Insomnia may be caused by another condition, situation, or substance, such as:  Anxiety.  Certain medicines.  Gastroesophageal reflux disease (GERD) or other gastrointestinal conditions.  Asthma or other breathing conditions.  Restless legs syndrome, sleep apnea, or other sleep disorders.  Chronic  pain.  Menopause.  Stroke.  Abuse of alcohol, tobacco, or illegal drugs.  Mental health conditions, such as depression.  Caffeine.  Neurological disorders, such as Alzheimer's disease.  An overactive thyroid (hyperthyroidism). Sometimes, the cause of insomnia may not be known. What increases the risk? Risk factors for insomnia include:  Gender. Women are affected more often than men.  Age. Insomnia is more common as you get older.  Stress.  Lack of exercise.  Irregular work schedule or working night shifts.  Traveling between different time zones.  Certain medical and mental health conditions. What are the signs or symptoms? If you have insomnia, the main symptom is having trouble falling asleep or having trouble staying asleep. This may lead to other symptoms, such as:  Feeling fatigued or having low energy.  Feeling nervous about going to sleep.  Not feeling rested in the morning.  Having trouble concentrating.  Feeling irritable, anxious, or depressed. How is this diagnosed? This condition may be diagnosed based on:  Your symptoms and medical history. Your health care provider may ask about: ? Your sleep habits. ? Any medical conditions you have. ? Your mental health.  A physical exam. How is this treated? Treatment for insomnia depends on the cause. Treatment may focus on treating an underlying condition that is causing insomnia. Treatment may also include:  Medicines to help you sleep.  Counseling or therapy.  Lifestyle adjustments to help you sleep better. Follow these instructions at home: Eating and drinking  Limit or avoid alcohol,  caffeinated beverages, and cigarettes, especially close to bedtime. These can disrupt your sleep.  Do not eat a large meal or eat spicy foods right before bedtime. This can lead to digestive discomfort that can make it hard for you to sleep.   Sleep habits  Keep a sleep diary to help you and your health care  provider figure out what could be causing your insomnia. Write down: ? When you sleep. ? When you wake up during the night. ? How well you sleep. ? How rested you feel the next day. ? Any side effects of medicines you are taking. ? What you eat and drink.  Make your bedroom a dark, comfortable place where it is easy to fall asleep. ? Put up shades or blackout curtains to block light from outside. ? Use a white noise machine to block noise. ? Keep the temperature cool.  Limit screen use before bedtime. This includes: ? Watching TV. ? Using your smartphone, tablet, or computer.  Stick to a routine that includes going to bed and waking up at the same times every day and night. This can help you fall asleep faster. Consider making a quiet activity, such as reading, part of your nighttime routine.  Try to avoid taking naps during the day so that you sleep better at night.  Get out of bed if you are still awake after 15 minutes of trying to sleep. Keep the lights down, but try reading or doing a quiet activity. When you feel sleepy, go back to bed.   General instructions  Take over-the-counter and prescription medicines only as told by your health care provider.  Exercise regularly, as told by your health care provider. Avoid exercise starting several hours before bedtime.  Use relaxation techniques to manage stress. Ask your health care provider to suggest some techniques that may work well for you. These may include: ? Breathing exercises. ? Routines to release muscle tension. ? Visualizing peaceful scenes.  Make sure that you drive carefully. Avoid driving if you feel very sleepy.  Keep all follow-up visits as told by your health care provider. This is important. Contact a health care provider if:  You are tired throughout the day.  You have trouble in your daily routine due to sleepiness.  You continue to have sleep problems, or your sleep problems get worse. Get help right  away if:  You have serious thoughts about hurting yourself or someone else. If you ever feel like you may hurt yourself or others, or have thoughts about taking your own life, get help right away. You can go to your nearest emergency department or call:  Your local emergency services (911 in the [place]  A suicide crisis helpline, such as the [place] at 2340297692. This is open 24 hours a day. Summary  Insomnia is a sleep disorder that makes it difficult to fall asleep or stay asleep.  Insomnia can be long-term (chronic) or short-term (acute).  Treatment for insomnia depends on the cause. Treatment may focus on treating an underlying condition that is causing insomnia.  Keep a sleep diary to help you and your health care provider figure out what could be causing your insomnia. This information is not intended to replace advice given to you by your health care provider. Make sure you discuss any questions you have with your health care provider. Document Revised: 07/20/2020 Document Reviewed: 07/20/2020 Elsevier Patient Education  2021 [employer].

## 2020-10-30 ENCOUNTER — Ambulatory Visit
Admission: RE | Admit: 2020-10-30 | Discharge: 2020-10-30 | Disposition: A | Discharge status: Home / Self Care | Payer: BC Managed Care – PPO | Source: Ambulatory Visit | Attending: Physician Assistant | Admitting: Physician Assistant

## 2020-10-30 DIAGNOSIS — E041 Nontoxic single thyroid nodule: Secondary | ICD-10-CM

## 2021-01-01 ENCOUNTER — Inpatient Hospital Stay: Admission: RE | Admit: 2021-01-01 | Payer: BC Managed Care – PPO | Source: Ambulatory Visit

## 2021-01-23 ENCOUNTER — Other Ambulatory Visit: Payer: Self-pay

## 2021-01-23 ENCOUNTER — Ambulatory Visit
Admission: RE | Admit: 2021-01-23 | Discharge: 2021-01-23 | Disposition: A | Discharge status: Home / Self Care | Payer: BC Managed Care – PPO | Source: Ambulatory Visit | Attending: Family Medicine | Admitting: Family Medicine

## 2021-01-23 DIAGNOSIS — Z1231 Encounter for screening mammogram for malignant neoplasm of breast: Secondary | ICD-10-CM

## 2021-01-24 ENCOUNTER — Other Ambulatory Visit: Payer: Self-pay | Admitting: Family Medicine

## 2021-01-24 DIAGNOSIS — R928 Other abnormal and inconclusive findings on diagnostic imaging of breast: Secondary | ICD-10-CM

## 2021-01-30 ENCOUNTER — Ambulatory Visit: Payer: BC Managed Care – PPO

## 2021-01-30 ENCOUNTER — Other Ambulatory Visit: Payer: Self-pay

## 2021-01-30 ENCOUNTER — Ambulatory Visit
Admission: RE | Admit: 2021-01-30 | Discharge: 2021-01-30 | Disposition: A | Discharge status: Home / Self Care | Payer: BC Managed Care – PPO | Source: Ambulatory Visit | Attending: Family Medicine | Admitting: Family Medicine

## 2021-01-30 DIAGNOSIS — R928 Other abnormal and inconclusive findings on diagnostic imaging of breast: Secondary | ICD-10-CM | POA: Diagnosis not present

## 2021-04-30 ENCOUNTER — Encounter: Payer: BC Managed Care – PPO | Admitting: Family Medicine

## 2021-05-01 ENCOUNTER — Telehealth (INDEPENDENT_AMBULATORY_CARE_PROVIDER_SITE_OTHER): Payer: BC Managed Care – PPO | Admitting: Family Medicine

## 2021-05-01 ENCOUNTER — Encounter: Payer: Self-pay | Admitting: Family Medicine

## 2021-05-01 DIAGNOSIS — U071 COVID-19: Secondary | ICD-10-CM

## 2021-05-01 MED ORDER — MOLNUPIRAVIR EUA 200MG CAPSULE: 4.0000 | ORAL_CAPSULE | Freq: Two times a day (BID) | ORAL | 0 refills | Status: AC

## 2021-05-01 MED ORDER — BENZONATATE 200 MG PO CAPS
200.0000 mg | ORAL_CAPSULE | Freq: Two times a day (BID) | ORAL | 0 refills | Status: DC | PRN
Start: 2021-05-01 — End: 2021-07-04

## 2021-05-01 NOTE — Progress Notes (Signed)
Virtual Visit via Video Note  I connected with Jaslin  on 05/01/21 at 12:00 PM EDT by a video enabled telemedicine application and verified that I am speaking with the correct person using two identifiers.  Location patient: home, [place] Location provider:work or home office Persons participating in the virtual visit: patient, provider  I discussed the limitations of evaluation and management by telemedicine and the availability of in person appointments. The patient expressed understanding and agreed to proceed.   HPI:  Acute telemedicine visit for Covid19: -Onset: 3 days ago, positive covid test today -Symptoms include:low grade fever, chills, fatigue, sore throat, body aches, cough -Denies:CP, SOB, NVD, inability to eat/drink/get out of bed -Has tried: nyquil, alka seltzer, robitussin -Pertinent past medical history: see below -Pertinent medication allergies: No Known Allergies -COVID-19 vaccine status: not fully vaccinated, had 2 doses and no boosters -no recent labs  ROS: See pertinent positives and negatives per HPI.  Past Medical History:  Diagnosis Date   Allergy    seasonal allergies   Benign colon polyp 10/25/2020   07/2020, Dr. Carlean Purl; q 10 yr recall   Esophageal reflux    hx of-with certain foods   Frequent UTI    Heart murmur    current    Past Surgical History:  Procedure Laterality Date   CHOLECYSTECTOMY     2008   DILATION AND CURETTAGE OF UTERUS     WISDOM TOOTH EXTRACTION       Current Outpatient Medications:    benzonatate (TESSALON) 200 MG capsule, Take 1 capsule (200 mg total) by mouth 2 (two) times daily as needed for cough., Disp: 20 capsule, Rfl: 0   molnupiravir EUA 200 mg CAPS, Take 4 capsules (800 mg total) by mouth 2 (two) times daily for 5 days., Disp: 40 capsule, Rfl: 0   traZODone (DESYREL) 50 MG tablet, Take 0.5-1 tablets (25-50 mg total) by mouth at bedtime as needed for sleep., Disp: 30 tablet, Rfl: 3  EXAM:  VITALS per patient if  applicable:  GENERAL: alert, oriented, appears well and in no acute distress  HEENT: atraumatic, conjunttiva clear, no obvious abnormalities on inspection of external nose and ears  NECK: normal movements of the head and neck  LUNGS: on inspection no signs of respiratory distress, breathing rate appears normal, no obvious gross SOB, gasping or wheezing  CV: no obvious cyanosis  MS: moves all visible extremities without noticeable abnormality  PSYCH/NEURO: pleasant and cooperative, no obvious depression or anxiety, speech and thought processing grossly intact  ASSESSMENT AND PLAN:  Discussed the following assessment and plan:  COVID-19   Discussed treatment options (infusions and oral options and risk of drug interactions), ideal treatment window, potential complications, isolation and precautions for COVID-19.  Discussed possibility of rebound with antivirals and the need to reisolate if it should occur for 5 days. Checked for/reviewed any labs done in the last 90 days with GFR listed in HPI if available. No recent labs. After lengthy discussion, the patient opted for treatment with molnupiravir due to being higher risk for complications of covid or severe disease and other factors. Discussed EUA status of this drug and the fact that there is preliminary limited knowledge of risks/interactions/side effects per EUA document vs possible benefits and precautions. This information was shared with patient during the visit and also was provided in patient instructions. Also, advised that patient discuss risks/interactions and use with pharmacist/treatment team as well. The patient did want a prescription for cough, Tessalon Rx sent.  Other symptomatic  care measures summarized in patient instructions. Work/School slipped offered: provided in patient instructions  Advised to seek prompt in person care if worsening, new symptoms arise, or if is not improving with treatment. Discussed options for  inperson care if PCP office not available. Did let this patient know that I only do telemedicine on Tuesdays and Thursdays for [place]. Advised to schedule follow up visit with PCP or [facility] if any further questions or concerns to avoid delays in care.   I discussed the assessment and treatment plan with the patient. The patient was provided an opportunity to ask questions and all were answered. The patient agreed with the plan and demonstrated an understanding of the instructions.     Lucretia Kern, DO

## 2021-05-01 NOTE — Patient Instructions (Addendum)
---------------------------------------------------------------------------------------------------------------------------    WORK SLIP:  Patient [name],  [birth year], was seen for a medical visit today, 05/01/21 . Please excuse from work for a COVID like illness. We advise 10 days minimum from the onset of symptoms (04/28/21) PLUS 1 day of no fever and improved symptoms. Will defer to employer for a sooner return to work if symptoms have resolved, it is greater than 5 days since the positive test and the patient can wear a high-quality, tight fitting mask such as N95 or KN95 at all times for an additional 5 days. Would also suggest COVID19 antigen testing is negative prior to return.  Sincerely: E-signature: Dr. Colin Benton, DO [facility] Ph: [phone]   ------------------------------------------------------------------------------------------------------------------------------   HOME CARE TIPS:   -I sent the medication(s) we discussed to your pharmacy: Meds ordered this encounter  Medications   benzonatate (TESSALON) 200 MG capsule    Sig: Take 1 capsule (200 mg total) by mouth 2 (two) times daily as needed for cough.    Dispense:  20 capsule    Refill:  0   molnupiravir EUA 200 mg CAPS    Sig: Take 4 capsules (800 mg total) by mouth 2 (two) times daily for 5 days.    Dispense:  40 capsule    Refill:  0     -I sent in the [place] treatment or referral you requested per our discussion. Please see the information provided below and discuss further with the pharmacist/treatment team.   -If taking an antiviral, there is a chance of rebound illness after finishing your treatment. If you become sick again please isolate for an additional 5 days.    -can use tylenol or aleve if needed for fevers, aches and pains per instructions  -can use nasal saline a few times per day if you have nasal congestion; sometimes  a short course of Afrin nasal  spray for 3 days can help with symptoms as well  -stay hydrated, drink plenty of fluids and eat small healthy meals - avoid dairy  -can take 1000 IU (88mg) Vit D3 and 100-500 mg of Vit C daily per instructions  -If the Covid test is positive, check out the [facility] for more information on home care, transmission and treatment for COVID19  -follow up with your doctor in 2-3 days unless improving and feeling better  -stay home while sick, except to seek medical care. If you have COVID19, ideally it would be best to stay home for a full 10 days since the onset of symptoms PLUS one day of no fever and feeling better. Wear a good mask that fits snugly (such as N95 or KN95) if around others to reduce the risk of transmission.  It was nice to meet you today, and I really hope you are feeling better soon. I help  out with telemedicine visits on Tuesdays and Thursdays and am available for visits on those days. If you have any concerns or questions following this visit please schedule a follow up visit with your Primary Care doctor or seek care at a local urgent care clinic to avoid delays in care.    Seek in person care or schedule a follow up video visit promptly if your symptoms worsen, new concerns arise or you are not improving with treatment. Call 911 and/or seek emergency care if your symptoms are severe or life threatening.    Fact Sheet for Patients And Caregivers Emergency Use Authorization (EUA) Of LAGEVRIOT (  molnupiravir) capsules For Coronavirus Disease 2019 (COVID-19)  What is the most important information I should know about LAGEVRIO? LAGEVRIO may cause serious side effects, including: ? LAGEVRIO may cause harm to your unborn baby. It is not known if LAGEVRIO will harm your baby if you take LAGEVRIO during pregnancy. o LAGEVRIO is not recommended for use in pregnancy. o LAGEVRIO has not been studied in pregnancy. LAGEVRIO was studied in pregnant animals only. When  LAGEVRIO was given to pregnant animals, LAGEVRIO caused harm to their unborn babies. o You and your healthcare provider may decide that you should take LAGEVRIO during pregnancy if there are no other COVID-19 treatment options approved or authorized by the FDA that are accessible or clinically appropriate for you. o If you and your healthcare provider decide that you should take LAGEVRIO during pregnancy, you and your healthcare provider should discuss the known and potential benefits and the potential risks of taking LAGEVRIO during pregnancy. For individuals who are able to become pregnant: ? You should use a reliable method of birth control (contraception) consistently and correctly during treatment with LAGEVRIO and for 4 days after the last dose of LAGEVRIO. Talk to your healthcare provider about reliable birth control methods. ? Before starting treatment with [facility] your healthcare provider may do a pregnancy test to see if you are pregnant before starting treatment with LAGEVRIO. ? Tell your healthcare provider right away if you become pregnant or think you may be pregnant during treatment with LAGEVRIO. Pregnancy Surveillance Program: ? There is a pregnancy surveillance program for individuals who take LAGEVRIO during pregnancy. The purpose of this program is to collect information about the health of you and your baby. Talk to your healthcare provider about how to take part in this program. ? If you take LAGEVRIO during pregnancy and you agree to participate in the pregnancy surveillance program and allow your healthcare provider to share your information with [place], then your healthcare provider will report your use of [place] during pregnancy to [place]. by calling [phone] or PeacefulBlog.es. For individuals who are sexually active with partners who are able to become pregnant: ? It is not known if LAGEVRIO can affect sperm.  While the risk is regarded as low, animal studies to fully assess the potential for LAGEVRIO to affect the babies of males treated with LAGEVRIO have not been completed. A reliable method of birth control (contraception) should be used consistently and correctly during treatment with LAGEVRIO and for at least 3 months after the last dose. The risk to sperm beyond 3 months is not known. Studies to understand the risk to sperm beyond 3 months are ongoing. Talk to your healthcare provider about reliable birth control methods. Talk to your healthcare provider if you have questions or concerns about how LAGEVRIO may affect sperm. You are being given this fact sheet because your healthcare provider believes it is necessary to provide you with LAGEVRIO for the treatment of adults with mild-to-moderate coronavirus disease 2019 (COVID-19) with positive results of direct SARS-CoV-2 viral testing, and who are at high risk for progression to severe COVID-19 including hospitalization or death, and for whom other COVID-19 treatment options approved or authorized by the FDA are not accessible or clinically appropriate. The U.S. Food and Drug Administration (FDA) has issued an Emergency Use Authorization (EUA) to make LAGEVRIO available during the COVID-19 pandemic (for more details about an EUA please see "What is an Emergency Use Authorization?" at the end of this  document). LAGEVRIO is not an FDA-approved medicine in the [place]. Read this Fact Sheet for information about LAGEVRIO. Talk to your healthcare provider about your options if you have any questions. It is your choice to take LAGEVRIO.  What is COVID-19? COVID-19 is caused by a virus called a coronavirus. You can get COVID-19 through close contact with another person who has the virus. COVID-19 illnesses have ranged from very mild-to-severe, including illness resulting in death. While information so far suggests that most COVID-19 illness  is mild, serious illness can happen and may cause some of your other medical conditions to become worse. Older people and people of all ages with severe, long lasting (chronic) medical conditions like heart disease, lung disease and diabetes, for example seem to be at higher risk of being hospitalized for COVID-19.  What is LAGEVRIO? LAGEVRIO is an investigational medicine used to treat mild-to-moderate COVID-19 in adults: ? with positive results of direct SARS-CoV-2 viral testing, and ? who are at high risk for progression to severe COVID-19 including hospitalization or death, and for whom other COVID-19 treatment options approved or authorized by the FDA are not accessible or clinically appropriate. The FDA has authorized the emergency use of LAGEVRIO for the treatment of mild-tomoderate COVID-19 in adults under an EUA. For more information on EUA, see the "What is an Emergency Use Authorization (EUA)?" section at the end of this Fact Sheet. LAGEVRIO is not authorized: ? for use in people less than 66 years of age. ? for prevention of COVID-19. ? for people needing hospitalization for COVID-19. ? for use for longer than 5 consecutive days.  What should I tell my healthcare provider before I take LAGEVRIO? Tell your healthcare provider if you: ? Have any allergies ? Are breastfeeding or plan to breastfeed ? Have any serious illnesses ? Are taking any medicines (prescription, over-the-counter, vitamins, or herbal products).  How do I take LAGEVRIO? ? Take LAGEVRIO exactly as your healthcare provider tells you to take it. ? Take 4 capsules of LAGEVRIO every 12 hours (for example, at 8 am and at 8 pm) ? Take LAGEVRIO for 5 days. It is important that you complete the full 5 days of treatment with LAGEVRIO. Do not stop taking LAGEVRIO before you complete the full 5 days of treatment, even if you feel better. ? Take LAGEVRIO with or without food. ? You should stay in isolation for as  long as your healthcare provider tells you to. Talk to your healthcare provider if you are not sure about how to properly isolate while you have COVID-19. ? Swallow LAGEVRIO capsules whole. Do not open, break, or crush the capsules. If you cannot swallow capsules whole, tell your healthcare provider. ? What to do if you miss a dose: o If it has been less than 10 hours since the missed dose, take it as soon as you remember o If it has been more than 10 hours since the missed dose, skip the missed dose and take your dose at the next scheduled time. ? Do not double the dose of LAGEVRIO to make up for a missed dose.  What are the important possible side effects of LAGEVRIO? ? See, "What is the most important information I should know about LAGEVRIO?" ? Allergic Reactions. Allergic reactions can happen in people taking LAGEVRIO, even after only 1 dose. Stop taking LAGEVRIO and call your healthcare provider right away if you get any of the following symptoms of an allergic reaction: o hives o rapid  heartbeat o trouble swallowing or breathing o swelling of the mouth, lips, or face o throat tightness o hoarseness o skin rash The most common side effects of LAGEVRIO are: ? diarrhea ? nausea ? dizziness These are not all the possible side effects of LAGEVRIO. Not many people have taken LAGEVRIO. Serious and unexpected side effects may happen. This medicine is still being studied, so it is possible that all of the risks are not known at this time.  What other treatment choices are there?  Veklury (remdesivir) is FDA-approved as an intravenous (IV) infusion for the treatment of mildto-moderate TIRWE-31 in certain adults and children. Talk with your doctor to see if Marijean Heath is appropriate for you. Like LAGEVRIO, FDA may also allow for the emergency use of other medicines to treat people with COVID-19. Go to  LacrosseProperties.si for more information. It is your choice to be treated or not to be treated with LAGEVRIO. Should you decide not to take it, it will not change your standard medical care.  What if I am breastfeeding? Breastfeeding is not recommended during treatment with LAGEVRIO and for 4 days after the last dose of LAGEVRIO. If you are breastfeeding or plan to breastfeed, talk to your healthcare provider about your options and specific situation before taking LAGEVRIO.  How do I report side effects with LAGEVRIO? Contact your healthcare provider if you have any side effects that bother you or do not go away. Report side effects to FDA MedWatch at SmoothHits.hu or call 1-800-FDA-1088 (1- (380)695-1777).  How should I store [place]? ? Store LAGEVRIO capsules at room temperature between 95F to 51F (20C to 25C). ? Keep LAGEVRIO and all medicines out of the reach of children and pets. How can I learn more about COVID-19? ? Ask your healthcare provider. ? Visit SeekRooms.co.uk ? Contact your local or state public health department. ? Call [employer] at [phone] (toll free in the [place] ? Visit www.molnupiravir.com  What Is an Emergency Use Authorization (EUA)? The [place] FDA has made [place] available under an emergency access mechanism called an Emergency Use Authorization (EUA) The EUA is supported by a Presenter, broadcasting Health and Human Service (HHS) declaration that circumstances exist to justify emergency use of drugs and biological products during the COVID-19 pandemic. LAGEVRIO for the treatment of mild-to-moderate COVID-19 in adults with positive results of direct SARS-CoV-2 viral testing, who are at high risk for progression to severe COVID-19, including hospitalization or death, and for whom alternative COVID-19 treatment options approved or  authorized by FDA are not accessible or clinically appropriate, has not undergone the same type of review as an FDA-approved product. In issuing an EUA under the KDTOI-71 public health emergency, the FDA has determined, among other things, that based on the total amount of scientific evidence available including data from adequate and well-controlled clinical trials, if available, it is reasonable to believe that the product may be effective for diagnosing, treating, or preventing COVID-19, or a serious or life-threatening disease or condition caused by COVID-19; that the known and potential benefits of the product, when used to diagnose, treat, or prevent such disease or condition, outweigh the known and potential risks of such product; and that there are no adequate, approved, and available alternatives.  All of these criteria must be met to allow for the product to be used in the treatment of patients during the COVID-19 pandemic. The EUA for LAGEVRIO is in effect for the duration of the COVID-19 declaration justifying emergency use of  LAGEVRIO, unless terminated or revoked (after which LAGEVRIO may no longer be used under the EUA). For patent information: http://rogers.info/ Copyright  2021-2022 [place] [place], [place] and its affiliates. All rights reserved. [id number] Revised: March 2022

## 2021-06-22 DIAGNOSIS — M7741 Metatarsalgia, right foot: Secondary | ICD-10-CM | POA: Diagnosis not present

## 2021-07-04 ENCOUNTER — Encounter: Payer: Self-pay | Admitting: Family Medicine

## 2021-07-04 ENCOUNTER — Ambulatory Visit (INDEPENDENT_AMBULATORY_CARE_PROVIDER_SITE_OTHER): Payer: BC Managed Care – PPO | Admitting: Family Medicine

## 2021-07-04 ENCOUNTER — Other Ambulatory Visit: Payer: Self-pay

## 2021-07-04 VITALS — BP 122/88 | HR 64 | Temp 98.0°F | Ht 66.0 in | Wt 142.8 lb

## 2021-07-04 DIAGNOSIS — E042 Nontoxic multinodular goiter: Secondary | ICD-10-CM

## 2021-07-04 DIAGNOSIS — G4709 Other insomnia: Secondary | ICD-10-CM | POA: Diagnosis not present

## 2021-07-04 DIAGNOSIS — Z0000 Encounter for general adult medical examination without abnormal findings: Secondary | ICD-10-CM | POA: Diagnosis not present

## 2021-07-04 DIAGNOSIS — Z7185 Encounter for immunization safety counseling: Secondary | ICD-10-CM

## 2021-07-04 LAB — CBC WITH DIFFERENTIAL/PLATELET
Basophils Absolute: 0 10*3/uL (ref 0.0–0.1)
Basophils Relative: 0.4 % (ref 0.0–3.0)
Eosinophils Absolute: 0 10*3/uL (ref 0.0–0.7)
Eosinophils Relative: 0.6 % (ref 0.0–5.0)
HCT: 42 % (ref 36.0–46.0)
Hemoglobin: 13.8 g/dL (ref 12.0–15.0)
Lymphocytes Relative: 36.3 % (ref 12.0–46.0)
Lymphs Abs: 1.9 10*3/uL (ref 0.7–4.0)
MCHC: 32.9 g/dL (ref 30.0–36.0)
MCV: 91 fl (ref 78.0–100.0)
Monocytes Absolute: 0.5 10*3/uL (ref 0.1–1.0)
Monocytes Relative: 10.3 % (ref 3.0–12.0)
Neutro Abs: 2.8 10*3/uL (ref 1.4–7.7)
Neutrophils Relative %: 52.4 % (ref 43.0–77.0)
Platelets: 259 10*3/uL (ref 150.0–400.0)
RBC: 4.61 Mil/uL (ref 3.87–5.11)
RDW: 13.3 % (ref 11.5–15.5)
WBC: 5.3 10*3/uL (ref 4.0–10.5)

## 2021-07-04 LAB — COMPREHENSIVE METABOLIC PANEL
ALT: 17 U/L (ref 0–35)
AST: 17 U/L (ref 0–37)
Albumin: 4.7 g/dL (ref 3.5–5.2)
Alkaline Phosphatase: 88 U/L (ref 39–117)
BUN: 9 mg/dL (ref 6–23)
CO2: 31 mEq/L (ref 19–32)
Calcium: 9.8 mg/dL (ref 8.4–10.5)
Chloride: 103 mEq/L (ref 96–112)
Creatinine, Ser: 0.61 mg/dL (ref 0.40–1.20)
GFR: 100.9 mL/min (ref >60.00)
Glucose, Bld: 70 mg/dL (ref 70–99)
Potassium: 4 mEq/L (ref 3.5–5.1)
Sodium: 140 mEq/L (ref 135–145)
Total Bilirubin: 0.5 mg/dL (ref 0.2–1.2)
Total Protein: 7.5 g/dL (ref 6.0–8.3)

## 2021-07-04 LAB — LIPID PANEL
Cholesterol: 201 mg/dL — ABNORMAL HIGH (ref 0–200)
HDL: 48 mg/dL (ref >39.00)
LDL Cholesterol: 137 mg/dL — ABNORMAL HIGH (ref 0–99)
NonHDL: 153.16
Total CHOL/HDL Ratio: 4
Triglycerides: 80 mg/dL (ref 0.0–149.0)
VLDL: 16 mg/dL (ref 0.0–40.0)

## 2021-07-04 LAB — TSH: TSH: 1.01 u[IU]/mL (ref 0.35–5.50)

## 2021-07-04 MED ORDER — GABAPENTIN 300 MG PO CAPS: 300.0000 mg | ORAL_CAPSULE | Freq: Every evening | ORAL | 2 refills | Status: DC | PRN

## 2021-07-04 NOTE — Progress Notes (Signed)
Subjective  Chief Complaint  Patient presents with   Annual Exam    Not fasting   Gastroesophageal Reflux    HPI: [name] is a 55 y.o. female who presents to [facility] today for a Female Wellness Visit. She also has the concerns and/or needs as listed above in the chief complaint. These will be addressed in addition to the Health Maintenance Visit.   Wellness Visit: annual visit with health maintenance review and exam without Pap  HM: pt sees Dr. Ulanda Edison; needs to schedule wellness; pap is overdue. Mammo and CRC screens are current. Feeling well  Chronic disease f/u and/or acute problem visit: (deemed necessary to be done in addition to the wellness visit): Insomnia: tried trazodone but gave her nightnmares. Has some bothersome hot flushes (improving overall though). Stressors and working late with difficulty winding down are main issues.  MNG: reviewed ENT eval 10/2020 and thyroid ultrasound with multiple nodules. No indication for bx: rec annual imaging surveillance.  Assessment  1. Annual physical exam   2. Multinodular goiter   3. Secondary insomnia   4. Vaccine counseling      Plan  Female Wellness Visit: Age appropriate Health Maintenance and Prevention measures were discussed with patient. Included topics are cancer screening recommendations, ways to keep healthy (see AVS) including dietary and exercise recommendations, regular eye and dental care, use of seat belts, and avoidance of moderate alcohol use and tobacco use. Pt to schedule with gyn for due pap BMI: discussed patient's BMI and encouraged positive lifestyle modifications to help get to or maintain a target BMI. HM needs and immunizations were addressed and ordered. See below for orders. See HM and immunization section for updates. Will return for flu and shingrix Routine labs and screening tests ordered including cmp, cbc and lipids where appropriate. Discussed recommendations  regarding Vit D and calcium supplementation (see AVS)  Chronic disease management visit and/or acute problem visit: MNG: ultrasound due in feb. Check tsh Insomnia: trial of gabapentin for hot flushes and sleep.   Follow up: 11mo for cpe  Orders Placed This Encounter  Procedures   CBC with Differential/Platelet   Comprehensive metabolic panel   Lipid panel   TSH   Meds ordered this encounter  Medications   gabapentin (NEURONTIN) 300 MG capsule    Sig: Take 1 capsule (300 mg total) by mouth at bedtime as needed (hot flushes, sleep).    Dispense:  30 capsule    Refill:  2      Body mass index is 23.05 kg/m. Wt Readings from Last 3 Encounters:  07/04/21 142 lb 12.8 oz (64.8 kg)  10/25/20 136 lb 3.2 oz (61.8 kg)  07/28/20 139 lb (63 kg)     Patient Active Problem List   Diagnosis Date Noted   Benign colon polyp 10/25/2020    07/2020, Dr. Carlean Purl; q 10 yr recall    Secondary insomnia 10/25/2020   Multinodular goiter 10/12/2020    Thyroid ultrasound 10/2020: ENT, several goiters: rec annual surveillance    GERD (gastroesophageal reflux disease) 07/28/2008   Health Maintenance  Topic Date Due   Zoster Vaccines- Shingrix (1 of 2) Never done   COVID-19 Vaccine (3 - Moderna risk series) 04/10/2020   PAP SMEAR-Modifier  01/12/2021   INFLUENZA VACCINE  04/23/2021   MAMMOGRAM  01/23/2022   TETANUS/TDAP  10/04/2027   COLONOSCOPY (Pts 45-46yrs Insurance coverage will need to be confirmed)  07/28/2030   Hepatitis C Screening  Completed  HPV VACCINES  Aged Out   HIV Screening  Discontinued   Immunization History  Administered Date(s) Administered   Influenza,inj,Quad PF,6+ Mos 10/12/2018, 10/25/2020   Moderna Sars-Covid-2 Vaccination 02/14/2020, 03/13/2020   Tdap 10/03/2017   We updated and reviewed the patient's past history in detail and it is documented below. Allergies: Patient has No Known Allergies. Past Medical History Patient  has a past medical history of  Allergy, Benign colon polyp (10/25/2020), Esophageal reflux, Frequent UTI, and Heart murmur. Past Surgical History Patient  has a past surgical history that includes Cholecystectomy; Wisdom tooth extraction; and Dilation and curettage of uterus. Family History: Patient family history includes Arthritis in her mother; Dementia in her paternal grandmother; Diabetes in her maternal grandmother and mother; Healthy in her sister and son; Heart disease in her maternal grandfather; Hypertension in her father and mother; Kidney disease in her maternal grandmother. Social History:  Patient  reports that she has never smoked. She has never used smokeless tobacco. She reports current alcohol use of about 2.0 standard drinks per week. She reports that she does not use drugs.  Review of Systems: Constitutional: negative for fever or malaise Ophthalmic: negative for photophobia, double vision or loss of vision Cardiovascular: negative for chest pain, dyspnea on exertion, or new LE swelling Respiratory: negative for SOB or persistent cough Gastrointestinal: negative for abdominal pain, change in bowel habits or melena Genitourinary: negative for dysuria or gross hematuria, no abnormal uterine bleeding or disharge Musculoskeletal: negative for new gait disturbance or muscular weakness Integumentary: negative for new or persistent rashes, no breast lumps Neurological: negative for TIA or stroke symptoms Psychiatric: negative for SI or delusions Allergic/Immunologic: negative for hives  Patient Care Team    Relationship Specialty Notifications Start End  Leamon Arnt, MD PCP - General Family Medicine  10/25/20   Newton Pigg, MD Consulting Physician Obstetrics and Gynecology  10/25/20   Gatha Mayer, MD Consulting Physician Gastroenterology  10/25/20   Marilynne Halsted, MD Consulting Physician Ophthalmology  10/25/20   Jolene Provost, PA-C Physician Assistant Otolaryngology  10/25/20     Objective   Vitals: BP 122/88   Pulse 64   Temp 98 F (36.7 C) (Temporal)   Ht 5\' 6"  (1.676 m)   Wt 142 lb 12.8 oz (64.8 kg)   LMP 04/24/2015   SpO2 99%   BMI 23.05 kg/m  General:  Well developed, well nourished, no acute distress  Psych:  Alert and orientedx3,normal mood and affect HEENT:  Normocephalic, atraumatic, non-icteric sclera,  supple neck without adenopathy, mass or thyromegaly Cardiovascular:  Normal S1, S2, RRR without gallop, rub or murmur Respiratory:  Good breath sounds bilaterally, CTAB with normal respiratory effort Gastrointestinal: normal bowel sounds, soft, non-tender, no noted masses. No HSM MSK: no deformities, contusions. Joints are without erythema or swelling.  Skin:  Warm, no rashes or suspicious lesions noted Neurologic:    Mental status is normal.  Normal gait. No tremor   Commons side effects, risks, benefits, and alternatives for medications and treatment plan prescribed today were discussed, and the patient expressed understanding of the given instructions. Patient is instructed to call or message via MyChart if he/she has any questions or concerns regarding our treatment plan. No barriers to understanding were identified. We discussed Red Flag symptoms and signs in detail. Patient expressed understanding regarding what to do in case of urgent or emergency type symptoms.  Medication list was reconciled, printed and provided to the patient in AVS. Patient instructions and summary information  was reviewed with the patient as documented in the AVS. This note was prepared with assistance of Dragon voice recognition software. Occasional wrong-word or sound-a-like substitutions may have occurred due to the inherent limitations of voice recognition software  This visit occurred during the SARS-CoV-2 public health emergency.  Safety protocols were in place, including screening questions prior to the visit, additional usage of staff PPE, and extensive cleaning of exam room while  observing appropriate contact time as indicated for disinfecting solutions.

## 2021-07-04 NOTE — Patient Instructions (Addendum)
Please return in 12 months for your annual complete physical; please come fasting.  You may schedule a nurse visit for flu shot and shingrix vaccinations at your convenience.  I will release your lab results to you on your MyChart account with further instructions. Please reply with any questions.   Please schedule with your GYN: your pap smear is overdue.   Calcium Intake Recommendations You can take Caltrate Plus twice a day or get it through your diet or other OTC supplements (Viactiv, OsCal etc)  Calcium is a mineral that affects many functions in the body, including: Blood clotting. Blood vessel function. Nerve impulse conduction. Hormone secretion. Muscle contraction. Bone and teeth functions.  Most of your body's calcium supply is stored in your bones and teeth. When your calcium stores are low, you may be at risk for low bone mass, bone loss, and bone fractures. Consuming enough calcium helps to grow healthy bones and teeth and to prevent breakdown over time. It is very important that you get enough calcium if you are: A child undergoing rapid growth. An adolescent girl. A pre- or post-menopausal woman. A woman whose menstrual cycle has stopped due to anorexia nervosa or regular intense exercise. An individual with lactose intolerance or a milk allergy. A vegetarian.  What is my plan? Try to consume the recommended amount of calcium daily based on your age. Depending on your overall health, your health care provider may recommend increased calcium intake. General daily calcium intake recommendations by age are: Birth to 6 months: 200 mg. Infants 7 to 12 months: 260 mg. Children 1 to 3 years: 700 mg. Children 4 to 8 years: 1,000 mg. Children 9 to 13 years: 1,300 mg. Teens 14 to 18 years: 1,300 mg. Adults 19 to 50 years: 1,000 mg. Adult women 51 to 70 years: 1,200 mg. Adult men 51 to 70 years: 1,000 mg. Adults 71 years and older: 1,200 mg. Pregnant and breastfeeding teens:  1,300 mg. Pregnant and breastfeeding adults: 1,000 mg.  What do I need to know about calcium intake? In order for the body to absorb calcium, it needs vitamin D. You can get vitamin D through (we recommend getting [phone] units of Vitamin D daily) Direct exposure of the skin to sunlight. Foods, such as egg yolks, liver, saltwater fish, and fortified milk. Supplements. Consuming too much calcium may cause: Constipation. Decreased absorption of iron and zinc. Kidney stones. Calcium supplements may interact with certain medicines. Check with your health care provider before starting any calcium supplements. Try to get most of your calcium from food. What foods can I eat? Grains  Fortified oatmeal. Fortified ready-to-eat cereals. Fortified frozen waffles. Vegetables Turnip greens. Broccoli. Fruits Fortified orange juice. Meats and Other Protein Sources Canned sardines with bones. Canned salmon with bones. Soy beans. Tofu. Baked beans. Almonds. [place] nuts. Sunflower seeds. Dairy Milk. Yogurt. Cheese. Cottage cheese. Beverages Fortified soy milk. Fortified rice milk. Sweets/Desserts Pudding. Ice Cream. Milkshakes. Blackstrap molasses. The items listed above may not be a complete list of recommended foods or beverages. Contact your dietitian for more options. What foods can affect my calcium intake? It may be more difficult for your body to use calcium or calcium may leave your body more quickly if you consume large amounts of: Sodium. Protein. Caffeine. Alcohol.  This information is not intended to replace advice given to you by your health care provider. Make sure you discuss any questions you have with your health care provider. Document Released: 04/23/2004 Document Revised: 03/29/2016 Document  Reviewed: 02/15/2014 Elsevier Interactive Patient Education  [employer].  If you have any questions or concerns, please don't hesitate to send me a message via MyChart or call  the office at [phone]. Thank you for visiting with [place] today! It's our pleasure caring for you.

## 2021-07-19 ENCOUNTER — Ambulatory Visit (INDEPENDENT_AMBULATORY_CARE_PROVIDER_SITE_OTHER): Payer: BC Managed Care – PPO

## 2021-07-19 ENCOUNTER — Other Ambulatory Visit: Payer: Self-pay

## 2021-07-19 DIAGNOSIS — Z23 Encounter for immunization: Secondary | ICD-10-CM

## 2021-08-06 DIAGNOSIS — E669 Obesity, unspecified: Secondary | ICD-10-CM | POA: Diagnosis not present

## 2021-08-06 DIAGNOSIS — Z1389 Encounter for screening for other disorder: Secondary | ICD-10-CM | POA: Diagnosis not present

## 2021-08-06 DIAGNOSIS — Z124 Encounter for screening for malignant neoplasm of cervix: Secondary | ICD-10-CM | POA: Diagnosis not present

## 2021-08-06 DIAGNOSIS — Z01419 Encounter for gynecological examination (general) (routine) without abnormal findings: Secondary | ICD-10-CM | POA: Diagnosis not present

## 2021-08-06 DIAGNOSIS — Z130 Encounter for screening for diseases of the blood and blood-forming organs and certain disorders involving the immune mechanism: Secondary | ICD-10-CM | POA: Diagnosis not present

## 2021-10-16 DIAGNOSIS — S6992XA Unspecified injury of left wrist, hand and finger(s), initial encounter: Secondary | ICD-10-CM | POA: Diagnosis not present

## 2021-10-16 DIAGNOSIS — S61208A Unspecified open wound of other finger without damage to nail, initial encounter: Secondary | ICD-10-CM | POA: Diagnosis not present

## 2021-10-16 DIAGNOSIS — Z6821 Body mass index (BMI) 21.0-21.9, adult: Secondary | ICD-10-CM | POA: Diagnosis not present

## 2021-10-16 DIAGNOSIS — Z23 Encounter for immunization: Secondary | ICD-10-CM | POA: Diagnosis not present

## 2021-10-16 DIAGNOSIS — T148XXA Other injury of unspecified body region, initial encounter: Secondary | ICD-10-CM | POA: Diagnosis not present

## 2021-10-26 ENCOUNTER — Other Ambulatory Visit: Payer: Self-pay | Admitting: Physician Assistant

## 2021-10-26 DIAGNOSIS — E042 Nontoxic multinodular goiter: Secondary | ICD-10-CM

## 2021-11-05 ENCOUNTER — Other Ambulatory Visit: Payer: Self-pay

## 2021-11-05 ENCOUNTER — Ambulatory Visit
Admission: RE | Admit: 2021-11-05 | Discharge: 2021-11-05 | Disposition: A | Discharge status: Home / Self Care | Payer: BC Managed Care – PPO | Source: Ambulatory Visit | Attending: Physician Assistant | Admitting: Physician Assistant

## 2021-11-05 DIAGNOSIS — E042 Nontoxic multinodular goiter: Secondary | ICD-10-CM

## 2021-11-06 DIAGNOSIS — L821 Other seborrheic keratosis: Secondary | ICD-10-CM | POA: Diagnosis not present

## 2022-01-09 DIAGNOSIS — D2261 Melanocytic nevi of right upper limb, including shoulder: Secondary | ICD-10-CM | POA: Diagnosis not present

## 2022-01-09 DIAGNOSIS — Z85828 Personal history of other malignant neoplasm of skin: Secondary | ICD-10-CM | POA: Diagnosis not present

## 2022-01-09 DIAGNOSIS — L820 Inflamed seborrheic keratosis: Secondary | ICD-10-CM | POA: Diagnosis not present

## 2022-01-09 DIAGNOSIS — D1801 Hemangioma of skin and subcutaneous tissue: Secondary | ICD-10-CM | POA: Diagnosis not present

## 2022-01-09 DIAGNOSIS — L812 Freckles: Secondary | ICD-10-CM | POA: Diagnosis not present

## 2022-04-10 IMAGING — MG MM DIGITAL DIAGNOSTIC UNILAT*L* W/ TOMO W/ CAD
6 series · 6 of 18 positions shown · non-contrast
Comparison: Previous exam(s).

CLINICAL DATA: 54-year-old female recalled from screening mammogram
dated 01/23/2021 for a possible left breast asymmetry.

EXAM:
DIGITAL DIAGNOSTIC UNILATERAL LEFT MAMMOGRAM WITH TOMOSYNTHESIS AND
CAD
TECHNIQUE: Left digital diagnostic mammography and breast tomosynthesis was
performed. The images were evaluated with computer-aided detection.

[L CC synth-2D (1 of 2)]
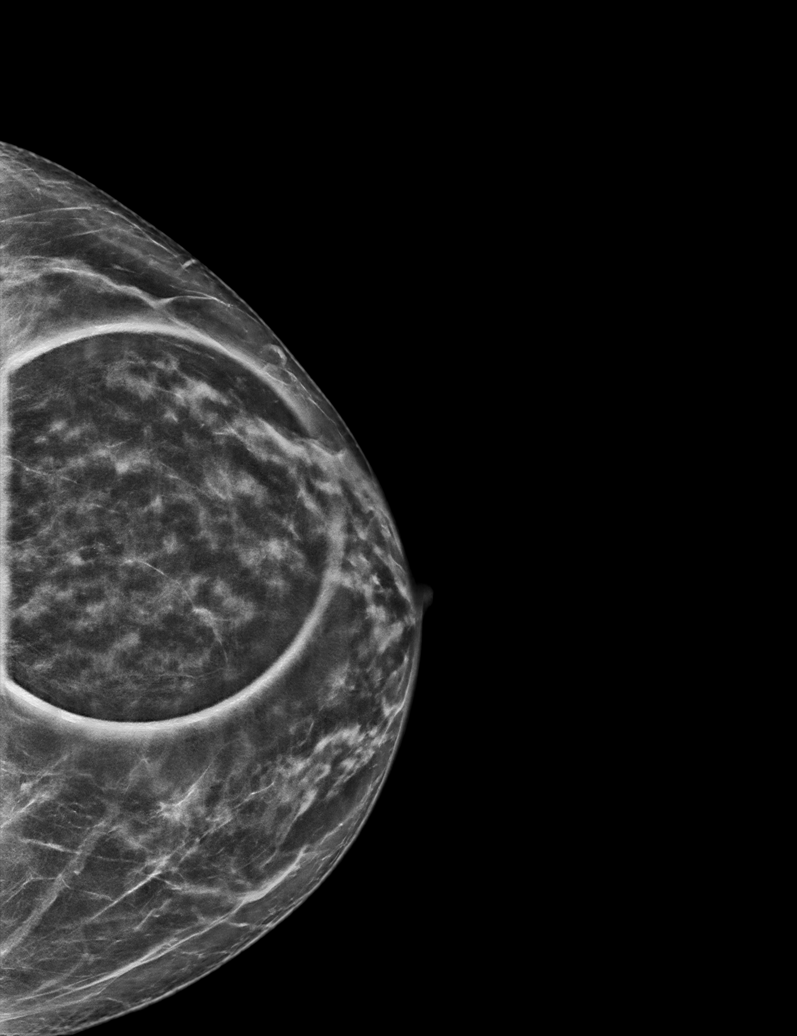

[L CC synth-2D (2 of 2)]
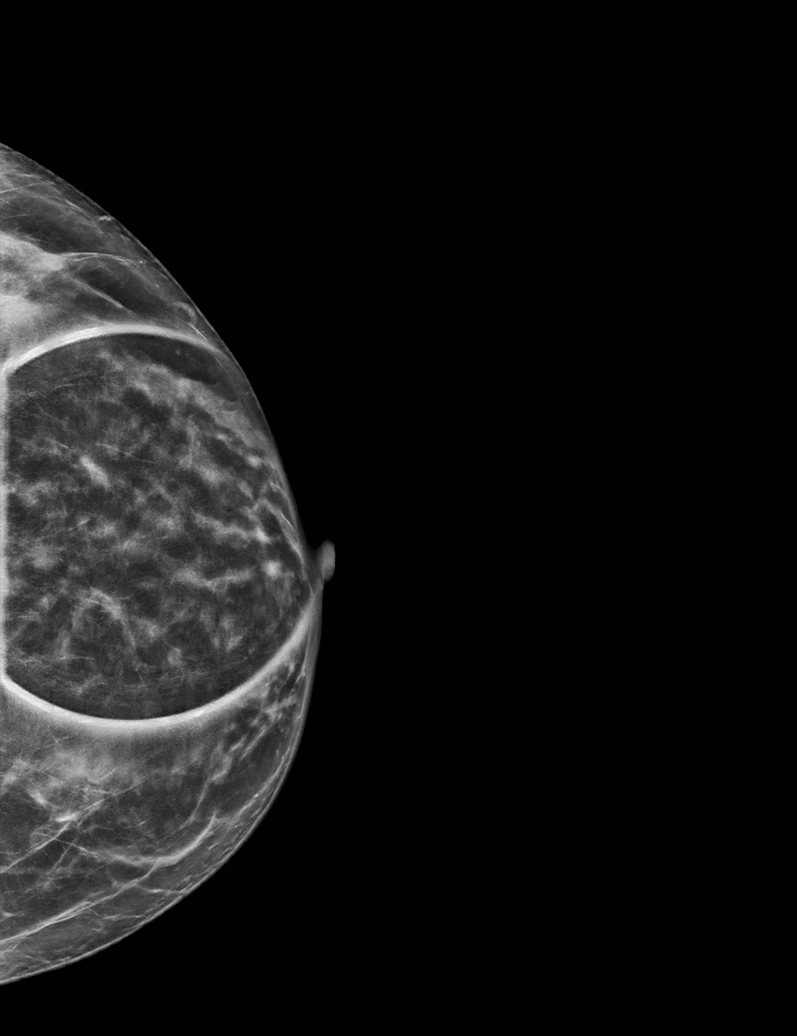

[L ML synth-2D]
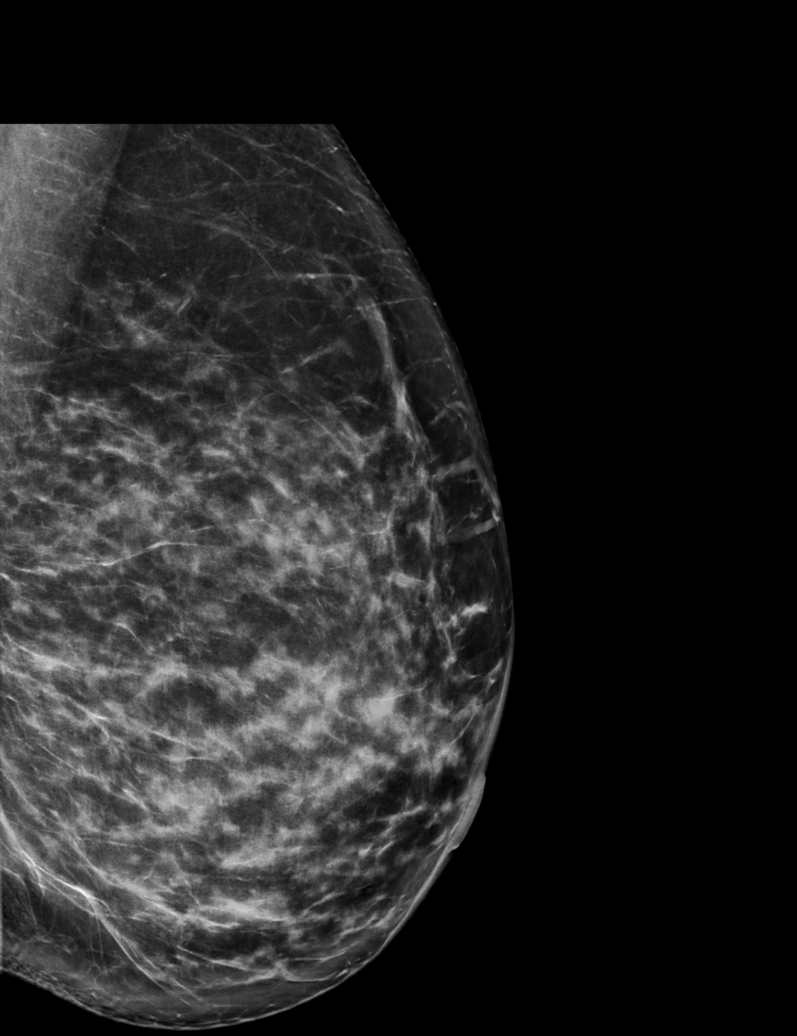

[L ML tomo · tomo slice 40/79.0]
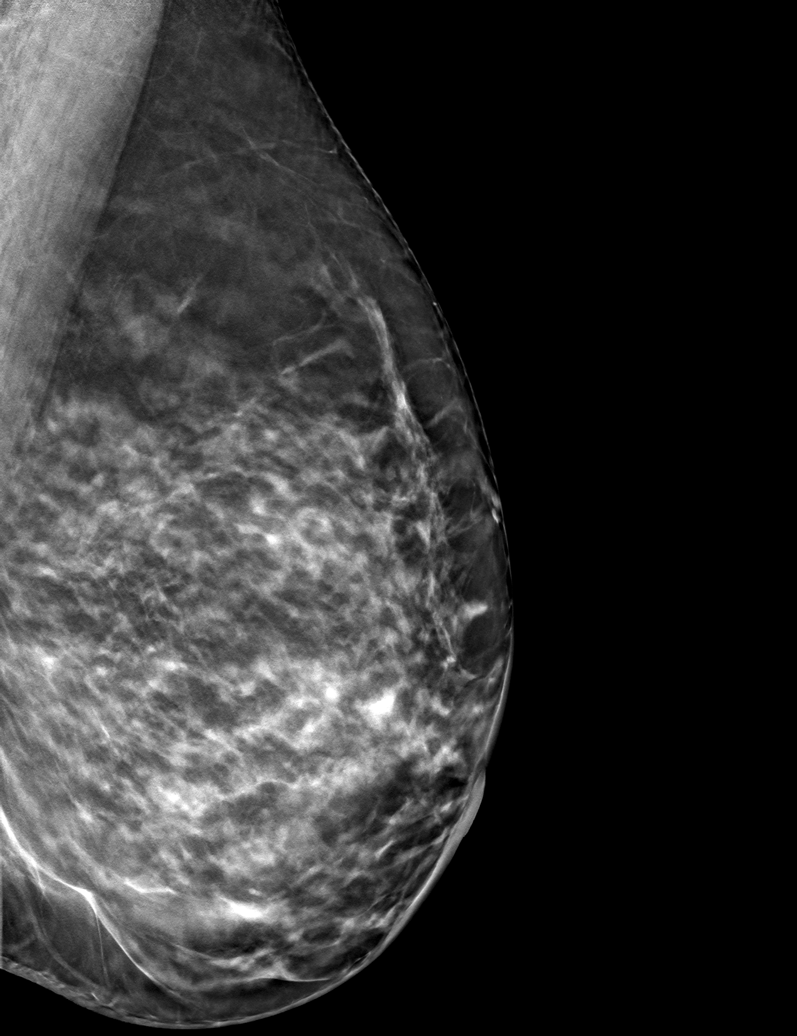

[L CC tomo (1 of 2) · tomo slice 33/66.0]
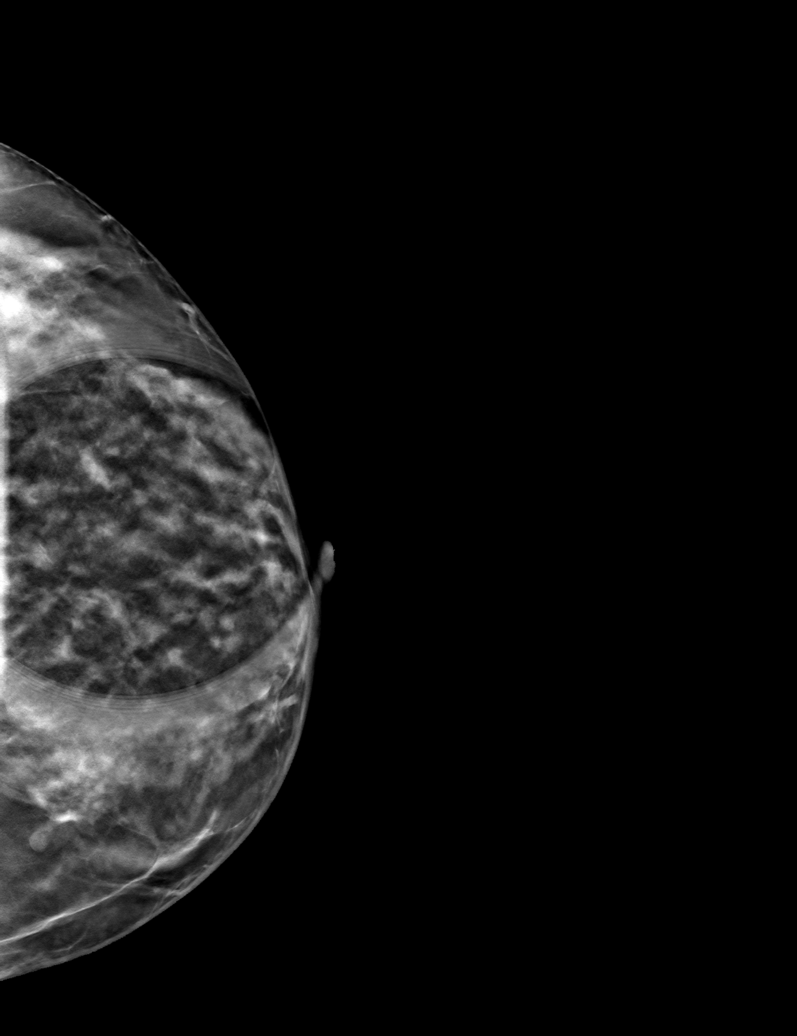

[L CC tomo (2 of 2) · tomo slice 37/73.0]
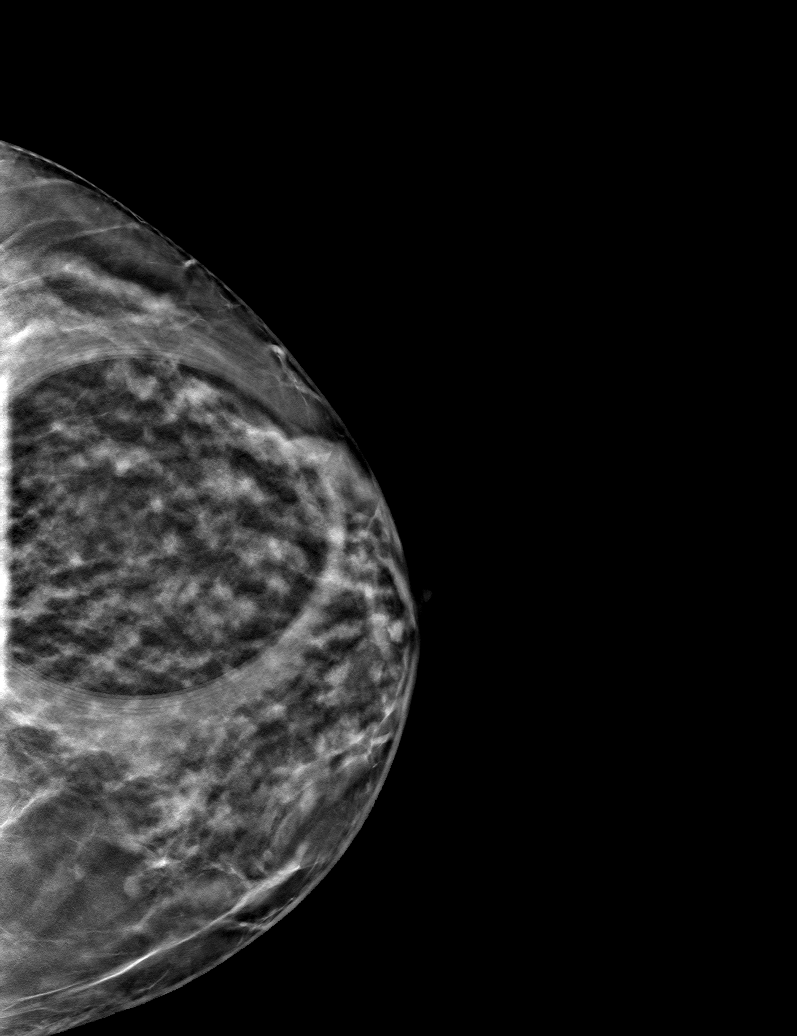

[6 of 18 positions shown; findings below may reference images not displayed]

ACR Breast Density Category c: The breast tissue is heterogeneously
dense, which may obscure small masses.
FINDINGS: Previously described, possible asymmetry in the slightly lateral
left breast at anterior depth is seen on the cc projection only
resolves into well dispersed fibroglandular tissue on today's
additional views. No suspicious findings are identified.
IMPRESSION: No mammographic evidence of malignancy.

RECOMMENDATION:
Screening mammogram in one year.(Code:[id number])

I have discussed the findings and recommendations with the patient.
If applicable, a reminder letter will be sent to the patient
regarding the next appointment.

BI-RADS CATEGORY  1: Negative.

## 2022-05-08 DIAGNOSIS — G5761 Lesion of plantar nerve, right lower limb: Secondary | ICD-10-CM | POA: Diagnosis not present

## 2022-06-17 ENCOUNTER — Encounter: Payer: Self-pay | Admitting: *Deleted

## 2022-09-05 ENCOUNTER — Encounter: Payer: Self-pay | Admitting: *Deleted

## 2022-09-06 DIAGNOSIS — M25562 Pain in left knee: Secondary | ICD-10-CM | POA: Diagnosis not present

## 2022-09-06 DIAGNOSIS — M25561 Pain in right knee: Secondary | ICD-10-CM | POA: Diagnosis not present

## 2022-09-30 DIAGNOSIS — M25561 Pain in right knee: Secondary | ICD-10-CM | POA: Diagnosis not present

## 2022-10-11 DIAGNOSIS — M25562 Pain in left knee: Secondary | ICD-10-CM | POA: Diagnosis not present

## 2022-10-11 DIAGNOSIS — M25561 Pain in right knee: Secondary | ICD-10-CM | POA: Diagnosis not present

## 2022-10-14 DIAGNOSIS — L820 Inflamed seborrheic keratosis: Secondary | ICD-10-CM | POA: Diagnosis not present

## 2022-10-21 DIAGNOSIS — M25561 Pain in right knee: Secondary | ICD-10-CM | POA: Diagnosis not present

## 2022-10-28 DIAGNOSIS — M25561 Pain in right knee: Secondary | ICD-10-CM | POA: Diagnosis not present

## 2022-11-15 ENCOUNTER — Encounter: Payer: Self-pay | Admitting: Family

## 2022-11-15 ENCOUNTER — Ambulatory Visit (INDEPENDENT_AMBULATORY_CARE_PROVIDER_SITE_OTHER): Payer: BC Managed Care – PPO | Admitting: Family

## 2022-11-15 VITALS — BP 111/69 | HR 70 | Temp 97.3°F | Ht 66.0 in | Wt 146.4 lb

## 2022-11-15 DIAGNOSIS — R300 Dysuria: Secondary | ICD-10-CM | POA: Diagnosis not present

## 2022-11-15 LAB — POCT URINALYSIS DIPSTICK
Bilirubin, UA: NEGATIVE
Blood, UA: NEGATIVE
Glucose, UA: NEGATIVE
Ketones, UA: NEGATIVE
Leukocytes, UA: NEGATIVE
Nitrite, UA: NEGATIVE
Protein, UA: NEGATIVE
Spec Grav, UA: 1.015 (ref 1.010–1.025)
Urobilinogen, UA: 0.2 E.U./dL
pH, UA: 5.5 (ref 5.0–8.0)

## 2022-11-15 NOTE — Progress Notes (Signed)
   Patient ID: [name], female    DOB: [birth year], 57 y.o.   MRN: [id number]  Chief Complaint  Patient presents with   Urinary Tract Infection    Pt states sx started 4-5 days ago, tad burning     HPI:      UTI sx:  felt slight dysuria about 4d ago, denies any frequency, urgency, back or pelvic pain, no hematuria, no fever. Reports have several UTIs when she was going thru menopause but has been doing well since then.     Assessment & Plan:  1. Burning with urination - UA neg. Advised her body may have resolved any start of infection. Continue to drink at least 2 liters = 64oz = 8 cups of water daily.  OK to take OTC AZO or other urinary tx if sx return or can certainly RTO.  - POCT Urinalysis Dipstick  Subjective:    Outpatient Medications Prior to Visit  Medication Sig Dispense Refill   gabapentin (NEURONTIN) 300 MG capsule Take 1 capsule (300 mg total) by mouth at bedtime as needed (hot flushes, sleep). (Patient not taking: Reported on 11/15/2022) 30 capsule 2   No facility-administered medications prior to visit.   Past Medical History:  Diagnosis Date   Allergy    seasonal allergies   Benign colon polyp 10/25/2020   07/2020, Dr. Carlean Purl; q 10 yr recall   Esophageal reflux    hx of-with certain foods   Frequent UTI    Heart murmur    current   Past Surgical History:  Procedure Laterality Date   CHOLECYSTECTOMY     2008   DILATION AND CURETTAGE OF UTERUS     WISDOM TOOTH EXTRACTION     No Known Allergies    Objective:    Physical Exam Vitals and nursing note reviewed.  Constitutional:      Appearance: Normal appearance.  Cardiovascular:     Rate and Rhythm: Normal rate and regular rhythm.  Pulmonary:     Effort: Pulmonary effort is normal.     Breath sounds: Normal breath sounds.  Musculoskeletal:        General: Normal range of motion.  Skin:    General: Skin is warm and dry.  Neurological:     Mental Status: She is alert.  Psychiatric:         Mood and Affect: Mood normal.        Behavior: Behavior normal.    BP 111/69 (BP Location: Left Arm, Patient Position: Sitting)   Pulse 70   Temp (!) 97.3 F (36.3 C) (Temporal)   Ht 5' 6"$  (1.676 m)   Wt 146 lb 6.4 oz (66.4 kg)   LMP 04/24/2015   SpO2 97%   BMI 23.63 kg/m  Wt Readings from Last 3 Encounters:  11/15/22 146 lb 6.4 oz (66.4 kg)  07/04/21 142 lb 12.8 oz (64.8 kg)  10/25/20 136 lb 3.2 oz (61.8 kg)       Jeanie Sewer, NP

## 2023-01-14 IMAGING — US US THYROID
1 series · 13 of 25 positions shown · non-contrast
Comparison: 10/30/2020

CLINICAL DATA: 55-year-old female with multiple thyroid nodules

EXAM:
THYROID ULTRASOUND
TECHNIQUE: Ultrasound examination of the thyroid gland and adjacent soft
tissues was performed.

[Series 1: us thyroid · 0.04mm/px · 13 of 46 slices shown]
[im 1/46]
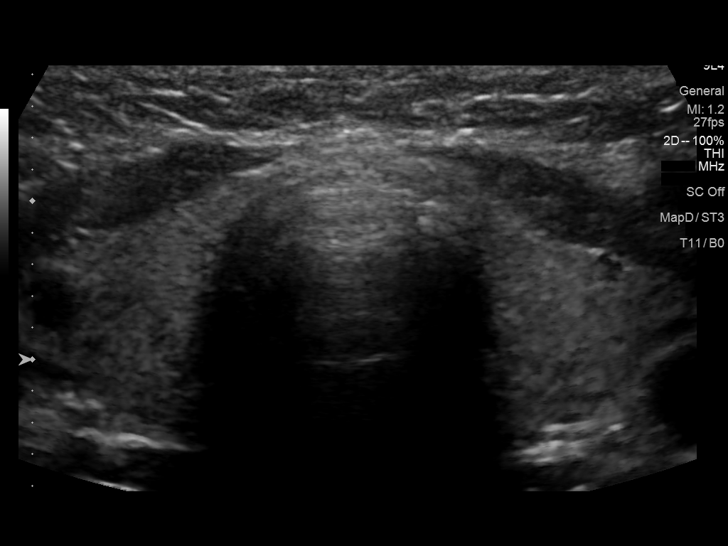
[im 4/46]
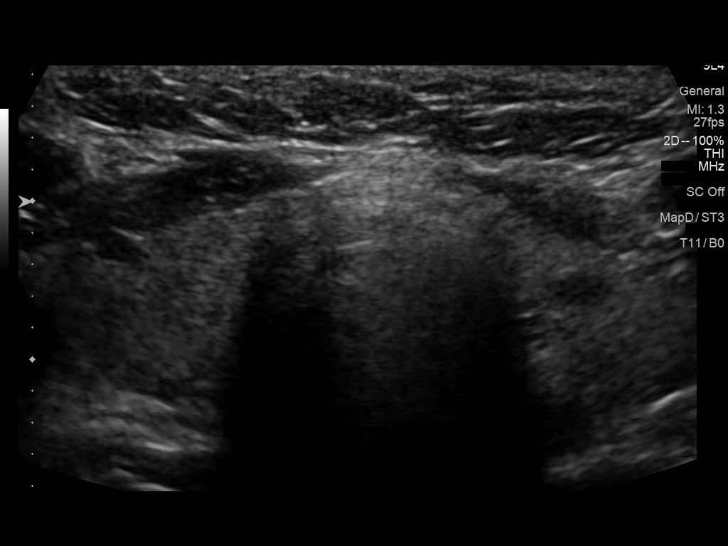
[im 8/46]
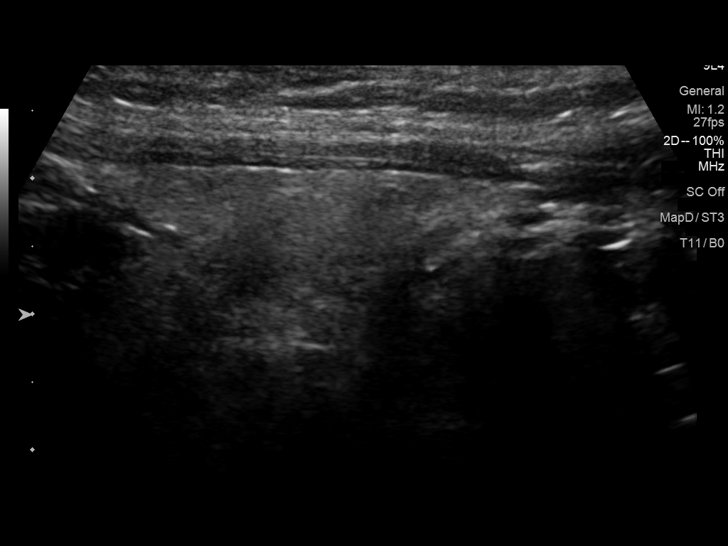
[im 12/46]
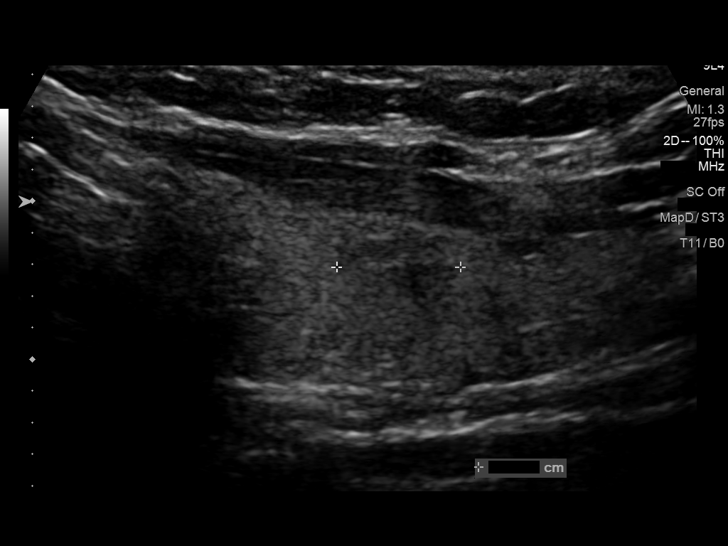
[im 16/46]
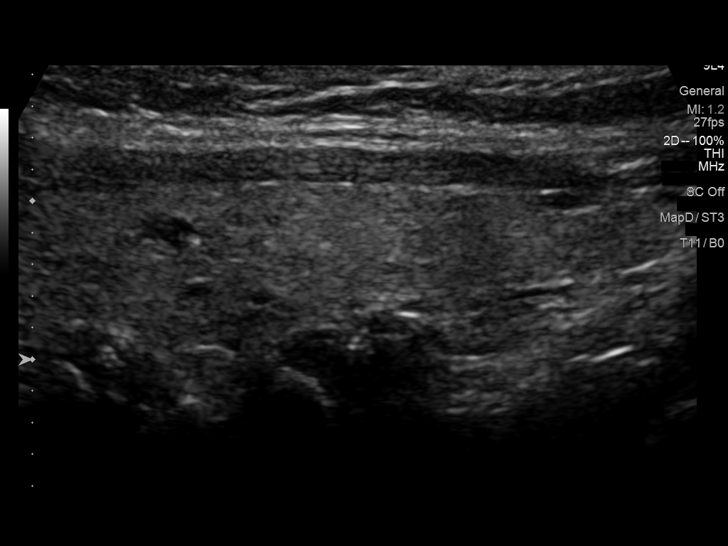
[im 19/46]
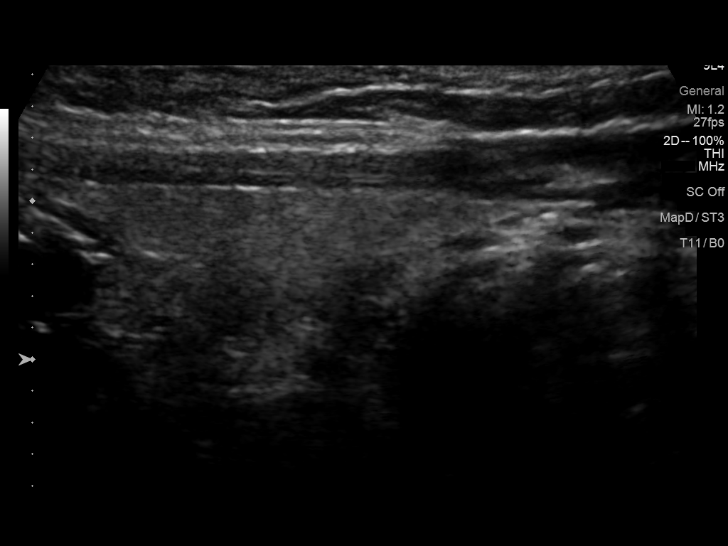
[im 23/46]
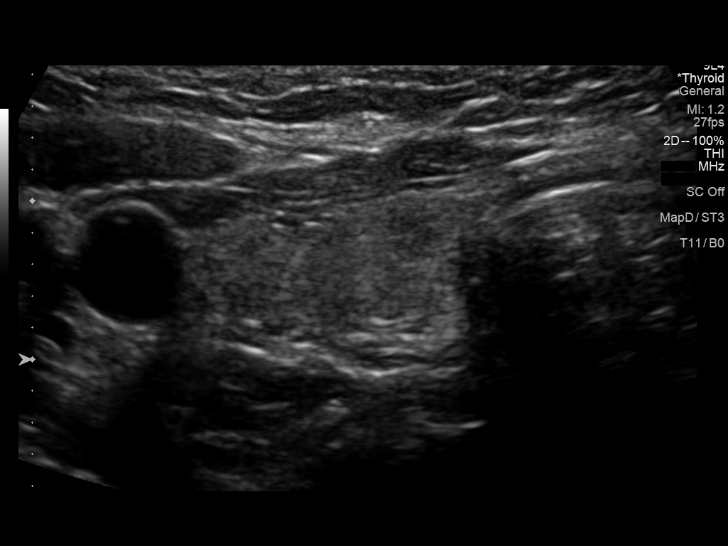
[im 27/46]
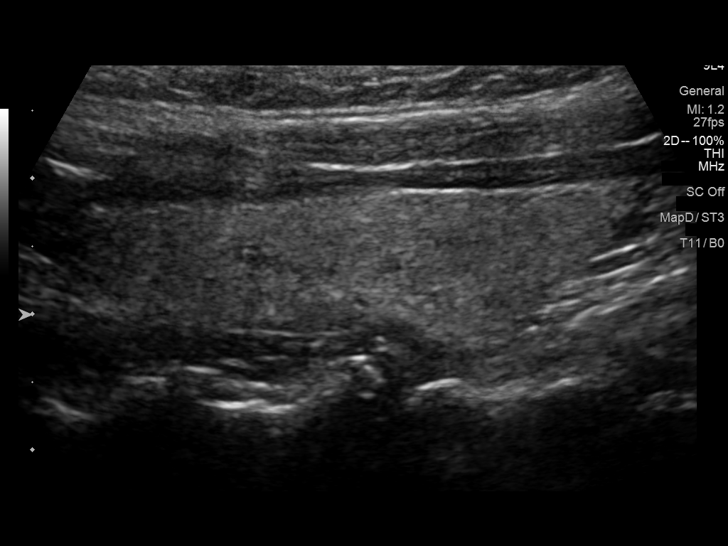
[im 31/46]
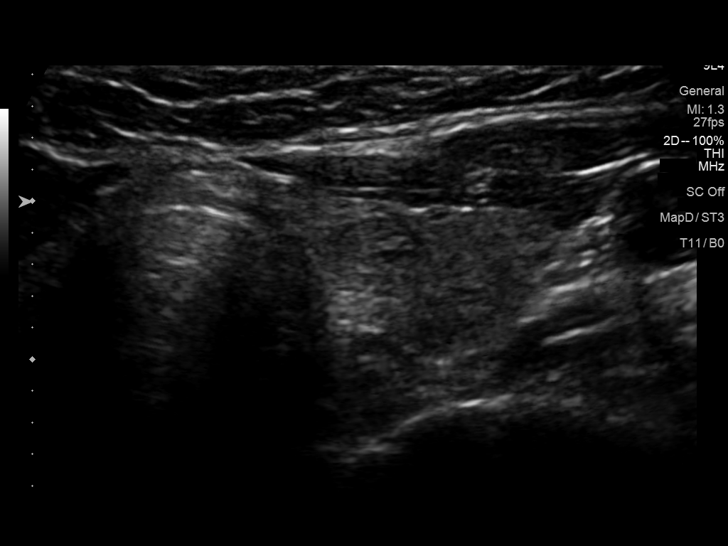
[im 34/46]
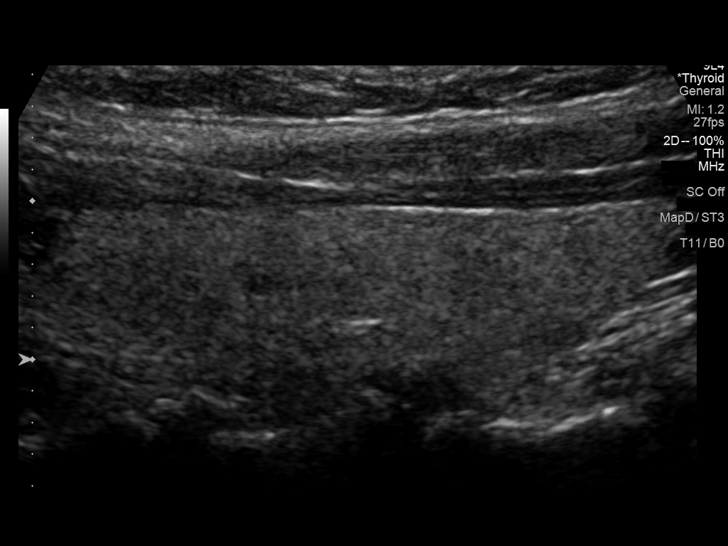
[im 38/46]
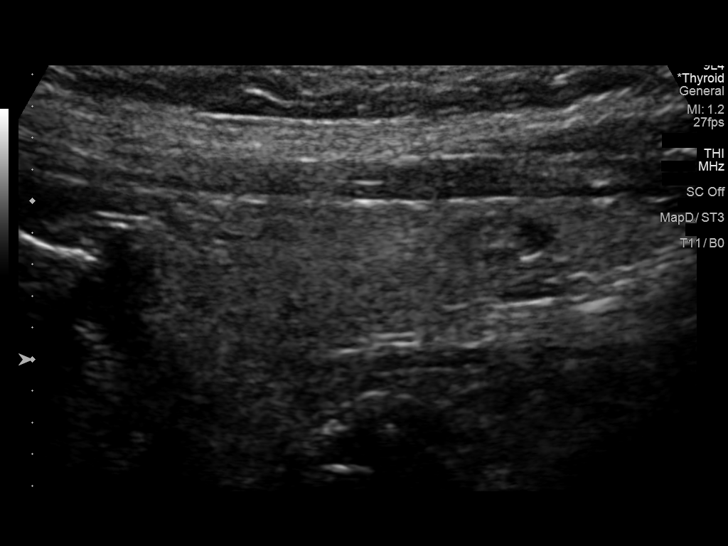
[im 42/46]
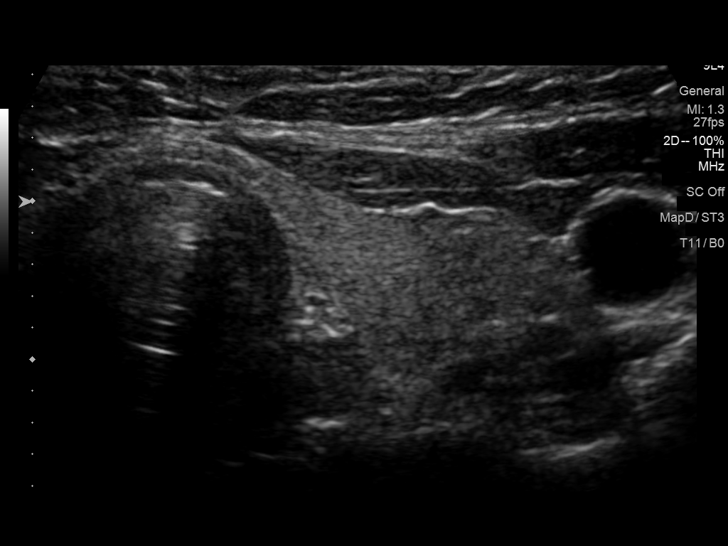
[im 46/46]
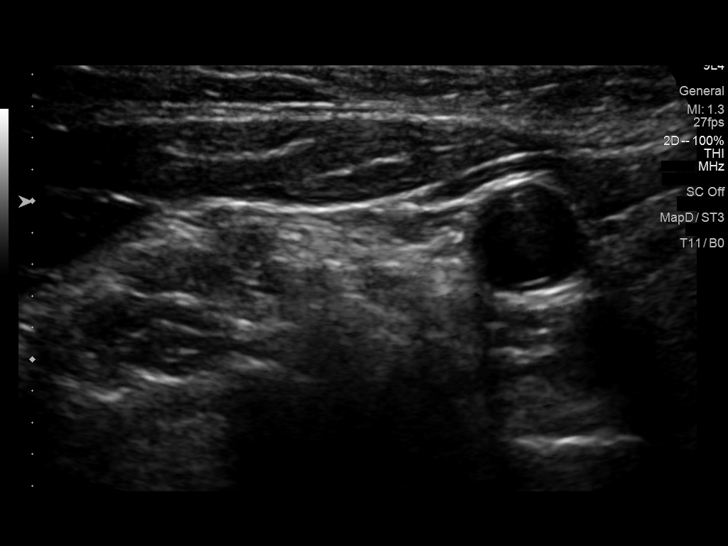

[13 of 25 positions shown; findings below may reference images not displayed]

FINDINGS: Parenchymal Echotexture: Mildly heterogenous

Isthmus: 0.2 cm

Right lobe: 4.5 cm x 1.1 cm x 1.6 cm

Left lobe: 4.5 cm x 1.1 cm x 1.7 cm

_________________________________________________________

Estimated total number of nodules >/= 1 cm: 1

Number of spongiform nodules >/=  2 cm not described below (TR1): 0

Number of mixed cystic and solid nodules >/= 1.5 cm not described
below (TR2): 0

_________________________________________________________

Nodule 1, right mid thyroid, 7 mm, compatible with colloid cyst and
does not meet criteria for surveillance.

Nodule labeled 2 inferior right thyroid, previously 1.6 cm,
currently 1.1 cm. Nodule has undergone involution, now with absence
of the cystic component with no evidence of residual colloid or
calcification. This has TR 3 characteristics on the current and no
longer meets criteria for surveillance.

Nodule labeled 3 mid left thyroid, 7 mm, and does not meet criteria
for surveillance.

Recommendations follow those established by the new ACR TI-RADS
criteria ([HOSPITAL] 9817;[DATE]).
IMPRESSION: Involution of the right inferior colloid cyst (labeled 2, now
cm), which now no longer meets criteria for further surveillance, as
above.

## 2023-04-17 ENCOUNTER — Other Ambulatory Visit: Payer: Self-pay | Admitting: Family Medicine

## 2023-04-17 DIAGNOSIS — Z0000 Encounter for general adult medical examination without abnormal findings: Secondary | ICD-10-CM

## 2023-05-12 ENCOUNTER — Ambulatory Visit
Admission: RE | Admit: 2023-05-12 | Discharge: 2023-05-12 | Disposition: A | Discharge status: Home / Self Care | Payer: BC Managed Care – PPO | Source: Ambulatory Visit | Attending: Family Medicine | Admitting: Family Medicine

## 2023-05-12 DIAGNOSIS — Z1231 Encounter for screening mammogram for malignant neoplasm of breast: Secondary | ICD-10-CM | POA: Diagnosis not present

## 2023-05-12 DIAGNOSIS — Z0000 Encounter for general adult medical examination without abnormal findings: Secondary | ICD-10-CM

## 2023-05-15 ENCOUNTER — Other Ambulatory Visit: Payer: Self-pay | Admitting: Family Medicine

## 2023-05-15 DIAGNOSIS — R928 Other abnormal and inconclusive findings on diagnostic imaging of breast: Secondary | ICD-10-CM

## 2023-05-22 ENCOUNTER — Ambulatory Visit
Admission: RE | Admit: 2023-05-22 | Discharge: 2023-05-22 | Disposition: A | Discharge status: Home / Self Care | Payer: BC Managed Care – PPO | Source: Ambulatory Visit | Attending: Family Medicine | Admitting: Family Medicine

## 2023-05-22 DIAGNOSIS — R928 Other abnormal and inconclusive findings on diagnostic imaging of breast: Secondary | ICD-10-CM

## 2023-05-22 DIAGNOSIS — N6325 Unspecified lump in the left breast, overlapping quadrants: Secondary | ICD-10-CM | POA: Diagnosis not present

## 2023-05-22 DIAGNOSIS — N6320 Unspecified lump in the left breast, unspecified quadrant: Secondary | ICD-10-CM | POA: Diagnosis not present

## 2023-05-23 ENCOUNTER — Other Ambulatory Visit: Payer: Self-pay | Admitting: Family Medicine

## 2023-05-23 DIAGNOSIS — N6320 Unspecified lump in the left breast, unspecified quadrant: Secondary | ICD-10-CM

## 2023-05-27 ENCOUNTER — Ambulatory Visit
Admission: RE | Admit: 2023-05-27 | Discharge: 2023-05-27 | Disposition: A | Discharge status: Home / Self Care | Payer: BC Managed Care – PPO | Source: Ambulatory Visit | Attending: Family Medicine | Admitting: Family Medicine

## 2023-05-27 DIAGNOSIS — C50812 Malignant neoplasm of overlapping sites of left female breast: Secondary | ICD-10-CM | POA: Diagnosis not present

## 2023-05-27 DIAGNOSIS — N6325 Unspecified lump in the left breast, overlapping quadrants: Secondary | ICD-10-CM | POA: Diagnosis not present

## 2023-05-27 DIAGNOSIS — N6320 Unspecified lump in the left breast, unspecified quadrant: Secondary | ICD-10-CM

## 2023-05-27 HISTORY — PX: BREAST BIOPSY: SHX20

## 2023-05-29 ENCOUNTER — Telehealth: Payer: Self-pay | Admitting: Hematology and Oncology

## 2023-05-29 NOTE — Telephone Encounter (Signed)
Spoke to patient to confirm upcoming afternoon [facility] clinic on 9/11, paperwork will be sent via mail.   Gave location and time, also informed patient that the surgeon's office would be calling as well to get information from them similar to the packet that they will be receiving so make sure to do both.  Reminded patient that all providers will be coming to the clinic to see them HERE and if they had any questions to not hesitate to reach back out to myself or their navigators.

## 2023-06-02 ENCOUNTER — Encounter: Payer: Self-pay | Admitting: *Deleted

## 2023-06-02 DIAGNOSIS — C50412 Malignant neoplasm of upper-outer quadrant of left female breast: Secondary | ICD-10-CM | POA: Insufficient documentation

## 2023-06-02 DIAGNOSIS — Z170 Estrogen receptor positive status [ER+]: Secondary | ICD-10-CM | POA: Insufficient documentation

## 2023-06-04 ENCOUNTER — Encounter: Payer: Self-pay | Admitting: *Deleted

## 2023-06-04 ENCOUNTER — Inpatient Hospital Stay: Payer: BC Managed Care – PPO | Attending: Hematology and Oncology | Admitting: Hematology and Oncology

## 2023-06-04 ENCOUNTER — Inpatient Hospital Stay (HOSPITAL_BASED_OUTPATIENT_CLINIC_OR_DEPARTMENT_OTHER): Payer: BC Managed Care – PPO | Admitting: Genetic Counselor

## 2023-06-04 ENCOUNTER — Ambulatory Visit: Payer: BC Managed Care – PPO | Attending: General Surgery | Admitting: Physical Therapy

## 2023-06-04 ENCOUNTER — Encounter: Payer: Self-pay | Admitting: Physical Therapy

## 2023-06-04 ENCOUNTER — Ambulatory Visit
Admission: RE | Admit: 2023-06-04 | Discharge: 2023-06-04 | Disposition: A | Discharge status: Home / Self Care | Payer: BC Managed Care – PPO | Source: Ambulatory Visit | Attending: Radiation Oncology | Admitting: Radiation Oncology

## 2023-06-04 ENCOUNTER — Encounter: Payer: Self-pay | Admitting: General Practice

## 2023-06-04 ENCOUNTER — Other Ambulatory Visit: Payer: Self-pay

## 2023-06-04 ENCOUNTER — Other Ambulatory Visit: Payer: Self-pay | Admitting: General Surgery

## 2023-06-04 ENCOUNTER — Encounter: Payer: Self-pay | Admitting: Genetic Counselor

## 2023-06-04 ENCOUNTER — Inpatient Hospital Stay: Payer: BC Managed Care – PPO | Admitting: *Deleted

## 2023-06-04 VITALS — BP 141/83 | HR 76 | Temp 97.9°F | Resp 18 | Ht 66.0 in | Wt 138.0 lb

## 2023-06-04 DIAGNOSIS — Z170 Estrogen receptor positive status [ER+]: Secondary | ICD-10-CM | POA: Insufficient documentation

## 2023-06-04 DIAGNOSIS — C50412 Malignant neoplasm of upper-outer quadrant of left female breast: Secondary | ICD-10-CM | POA: Diagnosis not present

## 2023-06-04 DIAGNOSIS — R293 Abnormal posture: Secondary | ICD-10-CM | POA: Insufficient documentation

## 2023-06-04 DIAGNOSIS — Z800 Family history of malignant neoplasm of digestive organs: Secondary | ICD-10-CM

## 2023-06-04 LAB — CBC WITH DIFFERENTIAL (CANCER CENTER ONLY)
Abs Immature Granulocytes: 0.01 10*3/uL (ref 0.00–0.07)
Basophils Absolute: 0 10*3/uL (ref 0.0–0.1)
Basophils Relative: 0 %
Eosinophils Absolute: 0 10*3/uL (ref 0.0–0.5)
Eosinophils Relative: 0 %
HCT: 43.8 % (ref 36.0–46.0)
Hemoglobin: 14.4 g/dL (ref 12.0–15.0)
Immature Granulocytes: 0 %
Lymphocytes Relative: 34 %
Lymphs Abs: 1.7 10*3/uL (ref 0.7–4.0)
MCH: 29.8 pg (ref 26.0–34.0)
MCHC: 32.9 g/dL (ref 30.0–36.0)
MCV: 90.5 fL (ref 80.0–100.0)
Monocytes Absolute: 0.5 10*3/uL (ref 0.1–1.0)
Monocytes Relative: 11 %
Neutro Abs: 2.7 10*3/uL (ref 1.7–7.7)
Neutrophils Relative %: 55 %
Platelet Count: 302 10*3/uL (ref 150–400)
RBC: 4.84 MIL/uL (ref 3.87–5.11)
RDW: 12.4 % (ref 11.5–15.5)
WBC Count: 5 10*3/uL (ref 4.0–10.5)
nRBC: 0 % (ref 0.0–0.2)

## 2023-06-04 LAB — CMP (CANCER CENTER ONLY)
ALT: 18 U/L (ref 0–44)
AST: 16 U/L (ref 15–41)
Albumin: 4.6 g/dL (ref 3.5–5.0)
Alkaline Phosphatase: 89 U/L (ref 38–126)
Anion gap: 5 (ref 5–15)
BUN: 9 mg/dL (ref 6–20)
CO2: 31 mmol/L (ref 22–32)
Calcium: 9.9 mg/dL (ref 8.9–10.3)
Chloride: 106 mmol/L (ref 98–111)
Creatinine: 0.64 mg/dL (ref 0.44–1.00)
GFR, Estimated: >60 mL/min (ref >60)
Glucose, Bld: 93 mg/dL (ref 70–99)
Potassium: 4 mmol/L (ref 3.5–5.1)
Sodium: 142 mmol/L (ref 135–145)
Total Bilirubin: 0.5 mg/dL (ref 0.3–1.2)
Total Protein: 7.7 g/dL (ref 6.5–8.1)

## 2023-06-04 LAB — GENETIC SCREENING ORDER

## 2023-06-04 NOTE — Progress Notes (Signed)
REFERRING PROVIDER: [name], MD [address] [place],  [place] [address]  PRIMARY PROVIDER:  [name], MD  PRIMARY REASON FOR VISIT:  1. Malignant neoplasm of upper-outer quadrant of left breast in female, estrogen receptor positive (HCC)   2. Family history of pancreatic cancer     HISTORY OF PRESENT ILLNESS:   Ms. Stanback, a 57 y.o. female, was seen for a [facility] cancer genetics consultation at the request of Dr. Pamelia Hoit due to a personal history of breast cancer.  Ms. Bua presents to clinic today to discuss the possibility of a hereditary predisposition to cancer, to discuss genetic testing, and to further clarify her future cancer risks, as well as potential cancer risks for family members.   In September 2024, at the age of 57, Ms. Amberg was diagnosed with invasive ductal caricnoma of the left breast (ER+/PR+/HER2-). The treatment plan is pending.   CANCER HISTORY:  Oncology History  Malignant neoplasm of upper-outer quadrant of left breast in female, estrogen receptor positive (HCC)  05/27/2023 Initial Diagnosis   Screening mammogram detected left breast asymmetry 12 o'clock position measuring 1.7 cm by ultrasound.  Ultrasound-guided biopsy revealed grade 3 IDC no LVI, ER 100%, PR 30%, Ki67 40%, HER2 negative   06/04/2023 Cancer Staging   Staging form: Breast, AJCC 8th Edition - Clinical: Stage IA (cT1c, cN0, cM0, G3, ER+, PR+, HER2-) - Signed by [name], MD on 06/04/2023 Stage prefix: Initial diagnosis Histologic grading system: 3 grade system      Past Medical History:  Diagnosis Date   Allergy    seasonal allergies   Benign colon polyp 10/25/2020   07/2020, Dr. Leone Payor; q 10 yr recall   Esophageal reflux    hx of-with certain foods   Frequent UTI    Heart murmur    current    Past Surgical History:  Procedure Laterality Date   BREAST BIOPSY Left 05/27/2023   [place] LT BREAST BX W LOC DEV 1ST LESION IMG BX SPEC US GUIDE 05/27/2023  [facility] MAMMOGRAPHY   CHOLECYSTECTOMY     2008   DILATION AND CURETTAGE OF UTERUS     WISDOM TOOTH EXTRACTION      FAMILY HISTORY:  We obtained a detailed, 4-generation family history.  Significant diagnoses are listed below: Family History  Problem Relation Age of Onset   Pancreatic cancer Maternal Uncle        dx >50   Lung cancer Maternal Uncle        dx >50     Ms. Hanke is unaware of previous family history of genetic testing for hereditary cancer risks. There is no reported Ashkenazi Jewish ancestry. There is no known consanguinity.  GENETIC COUNSELING ASSESSMENT: Ms. Pasternack is a 57 y.o. female with a personal and family history which is somewhat suggestive of a hereditary cancer syndrome and predisposition to cancer given the presence of related cancers in the family (breast, pancreatic). We, therefore, discussed and recommended the following at today's visit.   DISCUSSION: We discussed that 5 - 10% of cancer is hereditary.  Most cases of hereditary breast and pancreatic cancer are associated with mutations in BRCA1/2.  There are other genes that can be associated with hereditary breast and pancreatic cancer syndromes.  We discussed that testing is beneficial for several reasons including knowing how to follow individuals for their cancer risks, identifying whether potential surgery options would be beneficial, and understanding if other family members could be at risk for cancer and allowing them  to undergo genetic testing.   We reviewed the characteristics, features and inheritance patterns of hereditary cancer syndromes. We also discussed genetic testing, including the appropriate family members to test, and the process of testing. We discussed the implications of a negative, positive, carrier and/or variant of uncertain significant result. We offered Ms. Vanbrocklin genetic testing for a panel that includes genes associated with breast, pancreatic, and other cancers.   Based on Ms.  Stepka's personal history of breast cancer and a family history of pancreatic cancer, she meets NCCN medical criteria for genetic testing. Despite that she meets criteria, she may still have an out of pocket cost.   PLAN: Ms. Manfredo did not wish to pursue genetic testing at today's visit. She wants more time to consider genetic testing and may wish to proceed after treatment.   We understand this decision and remain available to coordinate genetic testing at any time in the future. We, therefore, recommend Ms. Dahms continue to follow the cancer screening guidelines given by her primary healthcare provider.  Ms. Nalbone questions were answered to her satisfaction today. Our contact information was provided should additional questions or concerns arise.   Miriya Cloer M. Rennie Plowman, MS, [facility] Genetic Counselor [email or web address] (P) [phone]  The patient was seen for a total of 15 minutes in face-to-face genetic counseling.  She was accompanied by her husband.  Dr. Pamelia Hoit available to discuss this case as needed.    _______________________________________________________________________ For Office Staff:  Number of people involved in session: 2 Was an Intern/ student involved with case: no

## 2023-06-04 NOTE — Progress Notes (Signed)
Radiation Oncology         [phone] ________________________________  Multidisciplinary Breast Oncology Clinic [facility]) Initial Outpatient Consultation  Name: EDESSA MORELLI MRN: [id number]  Date: 06/04/2023  DOB: 02-Jan-1966  CC:[name], MD  Almond Lint, MD   REFERRING PHYSICIAN: Almond Lint, MD  DIAGNOSIS: The encounter diagnosis was Malignant neoplasm of upper-outer quadrant of left breast in female, estrogen receptor positive (HCC).  Stage IA (cT1c, cN0, cM0) Left Breast UOQ, Invasive ductal carcinoma with high grade DCIS, ER+ / PR+ / Her2-, Grade 3  C50.412   ICD-10-CM   1. Malignant neoplasm of upper-outer quadrant of left breast in female, estrogen receptor positive (HCC)  C50.412    Z17.0       HISTORY OF PRESENT ILLNESS::Moncia DEYONNA VIEHMAN is a 57 y.o. female who is presenting to the office today for evaluation of her newly diagnosed breast cancer. She is accompanied by her supportive husband. She is doing well overall.   She had routine screening mammography on 05/12/23 showing a possible abnormality in the left breast. She underwent a left breast diagnostic mammography with tomography and left breast ultrasonography at [facility] on 05/22/23 showing: a highly suspicious mass in the 12 o'clock left breast measuring 1.7 cm. No abnormal appearing left axillary lymph nodes were appreciated.   Biopsy of the 12 o'clock left breast on 05/27/23 showed: grade 3 invasive ductal carcinoma measuring 12 mm in the greatest linear extent of the sample with high grade DCIS. Prognostic indicators significant for: estrogen receptor, 1002% positive with strong staining intensity and progesterone receptor, 30% positive with weak-strong staining intensity; Proliferation marker Ki67 at 40%. HER2 negative.  Menarche: 57 years old Age at first live birth: 57 years old GP: 1 LMP: postmenopausal (LMP date not indicated)  Contraceptive: yes for 10-15 years  HRT: never used    The patient was referred today for presentation in the multidisciplinary conference.  Radiology studies and pathology slides were presented there for review and discussion of treatment options.  A consensus was discussed regarding potential next steps.  PREVIOUS RADIATION THERAPY: No  PAST MEDICAL HISTORY:  Past Medical History:  Diagnosis Date   Allergy    seasonal allergies   Benign colon polyp 10/25/2020   07/2020, Dr. Leone Payor; q 10 yr recall   Esophageal reflux    hx of-with certain foods   Frequent UTI    Heart murmur    current    PAST SURGICAL HISTORY: Past Surgical History:  Procedure Laterality Date   BREAST BIOPSY Left 05/27/2023   [place] LT BREAST BX W LOC DEV 1ST LESION IMG BX SPEC US GUIDE 05/27/2023 [facility] MAMMOGRAPHY   CHOLECYSTECTOMY     2008   DILATION AND CURETTAGE OF UTERUS     WISDOM TOOTH EXTRACTION      FAMILY HISTORY:  Family History  Problem Relation Age of Onset   Diabetes Maternal Grandmother    Kidney disease Maternal Grandmother    Arthritis Mother    Hypertension Mother    Diabetes Mother    Hypertension Father    Healthy Sister    Healthy Son    Heart disease Maternal Grandfather    Dementia Paternal Grandmother    Colon polyps Neg Hx    Colon cancer Neg Hx    Esophageal cancer Neg Hx    Rectal cancer Neg Hx    Stomach cancer Neg Hx     SOCIAL HISTORY:  Social History   Socioeconomic History   Marital  status: Married    Spouse name: Not on file   Number of children: 1   Years of education: Not on file   Highest education level: Not on file  Occupational History   Occupation: owns hair salon/hair stylist    Employer: [employer]  Tobacco Use   Smoking status: Never   Smokeless tobacco: Never  Vaping Use   Vaping status: Never Used  Substance and Sexual Activity   Alcohol use: Yes    Alcohol/week: 2.0 standard drinks of alcohol    Types: 2 Standard drinks or equivalent per week    Comment: occas   Drug use: No   Sexual  activity: Yes    Birth control/protection: Post-menopausal  Other Topics Concern   Not on file  Social History Narrative   Not on file   Social Determinants of Health   Financial Resource Strain: Not on file  Food Insecurity: Not on file  Transportation Needs: Not on file  Physical Activity: Not on file  Stress: Not on file  Social Connections: Not on file    ALLERGIES: No Known Allergies  MEDICATIONS:  No current outpatient medications on file.   No current facility-administered medications for this encounter.    REVIEW OF SYSTEMS: A 10+ POINT REVIEW OF SYSTEMS WAS OBTAINED including neurology, dermatology, psychiatry, cardiac, respiratory, lymph, extremities, GI, GU, musculoskeletal, constitutional, reproductive, HEENT. On the provided form, she reports wearing glasses and a palpable breast lump. She denies any other symptoms.    PHYSICAL EXAM:     06/04/2023  Vitals with BMI   Height 5\' 6"    Weight 138 lbs   BMI 22.28   Systolic 141 !   Diastolic 83 !   Pulse 76       Lungs are clear to auscultation bilaterally. Heart has regular rate and rhythm. No palpable cervical, supraclavicular, or axillary adenopathy. Abdomen soft, non-tender, normal bowel sounds. Breast: Right breast with no palpable mass, nipple discharge, or bleeding. Left breast with a biopsy lesion covered with a bandage surrounded by some bruising. No other abnormalities appreciated.   KPS = 100  100 - Normal; no complaints; no evidence of disease. 90   - Able to carry on normal activity; minor signs or symptoms of disease. 80   - Normal activity with effort; some signs or symptoms of disease. 3   - Cares for self; unable to carry on normal activity or to do active work. 60   - Requires occasional assistance, but is able to care for most of his personal needs. 50   - Requires considerable assistance and frequent medical care. 40   - Disabled; requires special care and assistance. 30   - Severely  disabled; hospital admission is indicated although death not imminent. 20   - Very sick; hospital admission necessary; active supportive treatment necessary. 10   - Moribund; fatal processes progressing rapidly. 0     - Dead  Karnofsky DA, Abelmann WH, Craver LS and Burchenal [facility] [phone]) The use of the nitrogen mustards in the palliative treatment of carcinoma: with particular reference to bronchogenic carcinoma Cancer 1 [id number]  LABORATORY DATA:  Lab Results  Component Value Date   WBC 5.0 06/04/2023   HGB 14.4 06/04/2023   HCT 43.8 06/04/2023   MCV 90.5 06/04/2023   PLT 302 06/04/2023   Lab Results  Component Value Date   NA 142 06/04/2023   K 4.0 06/04/2023   CL 106 06/04/2023   CO2 31 06/04/2023  Lab Results  Component Value Date   ALT 18 06/04/2023   AST 16 06/04/2023   ALKPHOS 89 06/04/2023   BILITOT 0.5 06/04/2023    PULMONARY FUNCTION TEST:   Review Flowsheet        No data to display          RADIOGRAPHY: [place] LT BREAST BX W LOC DEV 1ST LESION IMG BX SPEC US GUIDE  Addendum Date: 05/28/2023   ADDENDUM REPORT: 05/28/2023 12:15 ADDENDUM: PATHOLOGY revealed: Site Breast, LEFT, needle core biopsy, 12 o'clock, 6 cm fn, coil clip INVASIVE POORLY DIFFERENTIATED DUCTAL ADENOCARCINOMA, GRADE 3 (3+3+2) HIGH-GRADE DUCTAL CARCINOMA IN SITU, SOLID AND CRIBRIFORM TYPES WITH NECROSIS - TUBULE FORMATION: SCORE 3 - NUCLEAR PLEOMORPHISM: SCORE 3 - MITOTIC COUNT: SCORE 2 - TOTAL SCORE: 8 - OVERALL GRADE: GRADE 3 (8/9) - NEGATIVE FOR LYMPHOVASCULAR INVASION - MICROCALCIFICATIONS PRESENT WITHIN INVASIVE TUMOR AND DCIS TUMOR MEASURES 12 MM IN GREATEST LINEAR EXTENT Pathology results are CONCORDANT with imaging findings, per Dr. Amie Portland. Pathology results and recommendations below were discussed with patient by telephone on 05/28/2023. Patient reported biopsy site with slight tenderness at the site. Post biopsy care instructions were reviewed, questions were answered and my direct phone  number was provided to patient. Patient was instructed to call the [facility] if any concerns or questions arise related to the biopsy. The patient was referred to the [facility] at [facility] with appointment on 06/04/2023. Pathology results reported by Lynett Grimes, RN on 05/28/2023. Electronically Signed   By: Amie Portland M.D.   On: 05/28/2023 12:15   Result Date: 05/28/2023 CLINICAL DATA:  Patient presents for ultrasound-guided core needle biopsy of a left breast mass. EXAM: ULTRASOUND GUIDED LEFT BREAST CORE NEEDLE BIOPSY COMPARISON:  Previous exam(s). PROCEDURE: I met with the patient and we discussed the procedure of ultrasound-guided biopsy, including benefits and alternatives. We discussed the high likelihood of a successful procedure. We discussed the risks of the procedure, including infection, bleeding, tissue injury, clip migration, and inadequate sampling. Informed written consent was given. The usual time-out protocol was performed immediately prior to the procedure. Lesion quadrant: Upper outer quadrant: Near 12 o'clock, 6 cm from the nipple. Using sterile technique and 1% Lidocaine as local anesthetic, under direct ultrasound visualization, a 12 gauge spring-loaded device was used to perform biopsy of the mass at 12 o'clock using a inferolateral approach. At the conclusion of the procedure a coil shaped tissue marker clip was deployed into the biopsy cavity. Follow up 2 view mammogram was performed and dictated separately. IMPRESSION: Ultrasound guided biopsy of a left breast mass. No apparent complications. Electronically Signed: By: Amie Portland M.D. On: 05/27/2023 08:07   MM CLIP PLACEMENT LEFT  Result Date: 05/27/2023 CLINICAL DATA:  Assessed post biopsy marker clip placement following ultrasound-guided core needle biopsy of a left breast mass. EXAM: 3D DIAGNOSTIC LEFT MAMMOGRAM POST ULTRASOUND BIOPSY  COMPARISON:  Previous exam(s). FINDINGS: 3D Mammographic images were obtained following ultrasound guided biopsy of a left breast mass. The biopsy marking clip is in expected position at the site of biopsy. IMPRESSION: Appropriate positioning of the coil shaped biopsy marking clip at the site of biopsy in the in the 12 o'clock position of the left breast, within the mass and associated area of distortion. Final Assessment: Post Procedure Mammograms for Marker Placement Electronically Signed   By: Amie Portland M.D.   On: 05/27/2023 08:16   MM 3D DIAGNOSTIC MAMMOGRAM  UNILATERAL LEFT BREAST  Result Date: 05/22/2023 CLINICAL DATA:  57 year old female presents for further evaluation of possible LEFT breast asymmetry on screening mammogram. EXAM: DIGITAL DIAGNOSTIC UNILATERAL LEFT MAMMOGRAM WITH TOMOSYNTHESIS AND CAD; ULTRASOUND LEFT BREAST LIMITED TECHNIQUE: Left digital diagnostic mammography and breast tomosynthesis was performed. The images were evaluated with computer-aided detection. ; Targeted ultrasound examination of the left breast was performed. COMPARISON:  Previous exam(s). ACR Breast Density Category c: The breasts are heterogeneously dense, which may obscure small masses. FINDINGS: Full field and spot compression views of the LEFT breast demonstrate a persistent irregular mass within the UPPER LEFT breast. Targeted ultrasound is performed, showing a 1.7 x 1.2 x 1.2 cm irregular hypoechoic mass at the 12 o'clock position of the LEFT breast 6 cm from the nipple. No abnormal appearing LEFT axillary lymph nodes are noted. IMPRESSION: Highly suspicious 1.7 cm UPPER LEFT breast mass. Tissue sampling is recommended. No abnormal appearing LEFT axillary lymph nodes. RECOMMENDATION: Ultrasound-guided LEFT breast biopsy, which will be scheduled. I have discussed the findings and recommendations with the patient. If applicable, a reminder letter will be sent to the patient regarding the next appointment. BI-RADS  CATEGORY  5: Highly suggestive of malignancy. Electronically Signed   By: Harmon Pier M.D.   On: 05/22/2023 15:21   [place] LIMITED ULTRASOUND INCLUDING AXILLA LEFT BREAST   Result Date: 05/22/2023 CLINICAL DATA:  57 year old female presents for further evaluation of possible LEFT breast asymmetry on screening mammogram. EXAM: DIGITAL DIAGNOSTIC UNILATERAL LEFT MAMMOGRAM WITH TOMOSYNTHESIS AND CAD; ULTRASOUND LEFT BREAST LIMITED TECHNIQUE: Left digital diagnostic mammography and breast tomosynthesis was performed. The images were evaluated with computer-aided detection. ; Targeted ultrasound examination of the left breast was performed. COMPARISON:  Previous exam(s). ACR Breast Density Category c: The breasts are heterogeneously dense, which may obscure small masses. FINDINGS: Full field and spot compression views of the LEFT breast demonstrate a persistent irregular mass within the UPPER LEFT breast. Targeted ultrasound is performed, showing a 1.7 x 1.2 x 1.2 cm irregular hypoechoic mass at the 12 o'clock position of the LEFT breast 6 cm from the nipple. No abnormal appearing LEFT axillary lymph nodes are noted. IMPRESSION: Highly suspicious 1.7 cm UPPER LEFT breast mass. Tissue sampling is recommended. No abnormal appearing LEFT axillary lymph nodes. RECOMMENDATION: Ultrasound-guided LEFT breast biopsy, which will be scheduled. I have discussed the findings and recommendations with the patient. If applicable, a reminder letter will be sent to the patient regarding the next appointment. BI-RADS CATEGORY  5: Highly suggestive of malignancy. Electronically Signed   By: Harmon Pier M.D.   On: 05/22/2023 15:21   MM 3D SCREENING MAMMOGRAM BILATERAL BREAST  Result Date: 05/14/2023 CLINICAL DATA:  Screening. EXAM: DIGITAL SCREENING BILATERAL MAMMOGRAM WITH TOMOSYNTHESIS AND CAD TECHNIQUE: Bilateral screening digital craniocaudal and mediolateral oblique mammograms were obtained. Bilateral screening digital breast  tomosynthesis was performed. The images were evaluated with computer-aided detection. COMPARISON:  Previous exam(s). ACR Breast Density Category c: The breasts are heterogeneously dense, which may obscure small masses. FINDINGS: In the left breast, a possible asymmetry warrants further evaluation. In the right breast, no findings suspicious for malignancy. IMPRESSION: Further evaluation is suggested for possible asymmetry in the left breast. RECOMMENDATION: Diagnostic mammogram and possibly ultrasound of the left breast. (Code:[id number]) The patient will be contacted regarding the findings, and additional imaging will be scheduled. BI-RADS CATEGORY  0: Incomplete: Need additional imaging evaluation. Electronically Signed   By: Meda Klinefelter M.D.   On: 05/14/2023 16:36  IMPRESSION: Stage IA (cT1c, cN0, cM0) Left Breast UOQ, Invasive ductal carcinoma with high grade DCIS, ER+ / PR+ / Her2-, Grade 3  She will be a good candidate for breast conservation with radiotherapy to left breast. We discussed the general course of radiation, potential side effects, and toxicities with radiation and the patient is interested in this approach. We plan to see her 3-4 weeks after her surgery. Patient understands that if chemotherapy is recommended, it would precede radiation. We anticipate 4 weeks of radiation, depending on the final surgical pathology. We look forward to participating in this patient's care.    PLAN:  Left breast lumpectomy with SLN biopsies Oncotype Dx  Adjuvant radiation therapy  Aromatase inhibitor     ------------------------------------------------   Joyice Faster, PA-C  Billie Lade, PhD, MD  This document serves as a record of services personally performed by Antony Blackbird, MD. It was created on his behalf by Neena Rhymes, a trained medical scribe. The creation of this record is based on the scribe's personal observations and the provider's statements to them. This document has  been checked and approved by the attending provider.

## 2023-06-04 NOTE — Therapy (Signed)
OUTPATIENT PHYSICAL THERAPY BREAST CANCER BASELINE EVALUATION   Patient Name: [name] MRN: [id number] DOB:1965/11/15, 57 y.o., female Today's Date: 06/04/2023  END OF SESSION:  PT End of Session - 06/04/23 1534     Visit Number 1    Number of Visits 2    Date for PT Re-Evaluation 07/30/23    PT Start Time 1406    PT Stop Time 1435    PT Time Calculation (min) 29 min    Activity Tolerance Patient tolerated treatment well    Behavior During Therapy [facility] for tasks assessed/performed             Past Medical History:  Diagnosis Date   Allergy    seasonal allergies   Benign colon polyp 10/25/2020   07/2020, Dr. Leone Neal; q 10 yr recall   Esophageal reflux    hx of-with certain foods   Frequent UTI    Heart murmur    current   Past Surgical History:  Procedure Laterality Date   BREAST BIOPSY Left 05/27/2023   [place] LT BREAST BX W LOC DEV 1ST LESION IMG BX SPEC US GUIDE 05/27/2023 [facility] MAMMOGRAPHY   CHOLECYSTECTOMY     2008   DILATION AND CURETTAGE OF UTERUS     WISDOM TOOTH EXTRACTION     Patient Active Problem List   Diagnosis Date Noted   Malignant neoplasm of upper-outer quadrant of left breast in female, estrogen receptor positive (HCC) 06/02/2023   Benign colon polyp 10/25/2020   Secondary insomnia 10/25/2020   Multinodular goiter 10/12/2020   GERD (gastroesophageal reflux disease) 07/28/2008    REFERRING PROVIDER: Dr. Almond Neal  REFERRING DIAG: Left breast cancer  THERAPY DIAG:  Malignant neoplasm of upper-outer quadrant of left breast in female, estrogen receptor positive (HCC)  Abnormal posture  Rationale for Evaluation and Treatment: Rehabilitation  ONSET DATE: 05/28/2023  SUBJECTIVE:                                                                                                                                                                                           SUBJECTIVE STATEMENT: Patient reports she is here today to be seen by her  medical team for her newly diagnosed left breast cancer.   PERTINENT HISTORY:  Patient was diagnosed on 05/28/2023 with left grade III invasive ductal carcinoma breast cancer. It measures 1.7 cm and is located in the upper outer quadrant. It is ER/PR positive and HER2 negative with a Ki67 of 40%.   PATIENT GOALS:   reduce lymphedema risk and learn post op HEP.   PAIN:  Are you having pain? No  PRECAUTIONS: Active CA  RED FLAGS: None   HAND DOMINANCE: right  WEIGHT BEARING RESTRICTIONS: No  FALLS:  Has patient fallen in last 6 months? No  LIVING ENVIRONMENT: Patient lives with: her husband Lives in: House/apartment Has following equipment at home: None  OCCUPATION: Hairdresser  LEISURE: She walks 4x/week for 1-2 miles  PRIOR LEVEL OF FUNCTION: Independent   OBJECTIVE:  COGNITION: Overall cognitive status: Within functional limits for tasks assessed    POSTURE:  Forward head and rounded shoulders posture  UPPER EXTREMITY AROM/PROM:  A/PROM RIGHT   eval   Shoulder extension 52  Shoulder flexion 153  Shoulder abduction 157  Shoulder internal rotation 64  Shoulder external rotation 88    (Blank rows = not tested)  A/PROM LEFT   eval  Shoulder extension 50  Shoulder flexion 140  Shoulder abduction 163  Shoulder internal rotation 78  Shoulder external rotation 88    (Blank rows = not tested)  CERVICAL AROM: All within normal limits  UPPER EXTREMITY STRENGTH: WNL  LYMPHEDEMA ASSESSMENTS (in cm):   LANDMARK RIGHT   eval  10 cm proximal to olecranon process 25.3  Olecranon process 22.7  10 cm proximal to ulnar styloid process 19.5  Just proximal to ulnar styloid process 14.6  Across hand at thumb web space 17.5  At base of 2nd digit 6  (Blank rows = not tested)  LANDMARK LEFT   eval  10 cm proximal to olecranon process 24.7  Olecranon process 22.2  10 cm proximal to ulnar styloid process 19.1  Just proximal to ulnar styloid process 14.3   Across hand at thumb web space 18  At base of 2nd digit 6  (Blank rows = not tested)  L-DEX LYMPHEDEMA SCREENING:  The patient was assessed using the L-Dex machine today to produce a lymphedema index baseline score. The patient will be reassessed on a regular basis (typically every 3 months) to obtain new L-Dex scores. If the score is > 6.5 points away from his/her baseline score indicating onset of subclinical lymphedema, it will be recommended to wear a compression garment for 4 weeks, 12 hours per day and then be reassessed. If the score continues to be > 6.5 points from baseline at reassessment, we will initiate lymphedema treatment. Assessing in this manner has a 95% rate of preventing clinically significant lymphedema.   L-DEX FLOWSHEETS - 06/04/23 1500       L-DEX LYMPHEDEMA SCREENING   Measurement Type Unilateral    L-DEX MEASUREMENT EXTREMITY Upper Extremity    POSITION  Standing    DOMINANT SIDE Right    At Risk Side Left    BASELINE SCORE (UNILATERAL) -0.9             QUICK DASH SURVEY:  [name] - 06/04/23 0001     Open a tight or new jar No difficulty    Do heavy household chores (wash walls, wash floors) No difficulty    Carry a shopping bag or briefcase No difficulty    Wash your back No difficulty    Use a knife to cut food No difficulty    Recreational activities in which you take some force or impact through your arm, shoulder, or hand (golf, hammering, tennis) No difficulty    During the past week, to what extent has your arm, shoulder or hand problem interfered with your normal social activities with family, friends, neighbors, or groups? Not at all    During the past week, to what extent has your arm, shoulder or  hand problem limited your work or other regular daily activities Not at all    Arm, shoulder, or hand pain. None    Tingling (pins and needles) in your arm, shoulder, or hand None    Difficulty Sleeping No difficulty    DASH Score 0 %               PATIENT EDUCATION:  Education details: Lymphedema risk reduction and post op shoulder/posture HEP Person educated: Patient Education method: Explanation, Demonstration, Handout Education comprehension: Patient verbalized understanding and returned demonstration  HOME EXERCISE PROGRAM: Patient was instructed today in a home exercise program today for post op shoulder range of motion. These included active assist shoulder flexion in sitting, scapular retraction, wall walking with shoulder abduction, and hands behind head external rotation.  She was encouraged to do these twice a day, holding 3 seconds and repeating 5 times when permitted by her physician.   ASSESSMENT:  CLINICAL IMPRESSION: Patient was diagnosed on 05/28/2023 with left grade III invasive ductal carcinoma breast cancer. It measures 1.7 cm and is located in the upper outer quadrant. It is ER/PR positive and HER2 negative with a Ki67 of 40%. Her multidisciplinary medical team met prior to her assessments to determine a recommended treatment plan. She is planning to have a left lumpectomy and sentinel node biopsy followed by Oncotype testing, radiation, and anti-estrogen therapy. She will benefit from a post op PT reassessment to determine needs and from L-Dex screens every 3 months for 2 years to detect subclinical lymphedema.  Pt will benefit from skilled therapeutic intervention to improve on the following deficits: Decreased knowledge of precautions, impaired UE functional use, pain, decreased ROM, postural dysfunction.   PT treatment/interventions: ADL/self-care home management, pt/family education, therapeutic exercise  REHAB POTENTIAL: Excellent  CLINICAL DECISION MAKING: Stable/uncomplicated  EVALUATION COMPLEXITY: Low   GOALS: Goals reviewed with patient? YES  LONG TERM GOALS: (STG=LTG)    Name Target Date Goal status  1 Pt will be able to verbalize understanding of pertinent lymphedema risk reduction  practices relevant to her dx specifically related to skin care.  Baseline:  No knowledge 06/04/2023 Achieved at eval  2 Pt will be able to return demo and/or verbalize understanding of the post op HEP related to regaining shoulder ROM. Baseline:  No knowledge 06/04/2023 Achieved at eval  3 Pt will be able to verbalize understanding of the importance of attending the post op After Breast CA Class for further lymphedema risk reduction education and therapeutic exercise.  Baseline:  No knowledge 06/04/2023 Achieved at eval  4 Pt will demo she has regained full shoulder ROM and function post operatively compared to baselines.  Baseline: See objective measurements taken today. 07/30/2023     PLAN:  PT FREQUENCY/DURATION: EVAL and 1 follow up appointment.   PLAN FOR NEXT SESSION: will reassess 3-4 weeks post op to determine needs.   Patient will follow up at outpatient cancer rehab 3-4 weeks following surgery.  If the patient requires physical therapy at that time, a specific plan will be dictated and sent to the referring physician for approval. The patient was educated today on appropriate basic range of motion exercises to begin post operatively and the importance of attending the After Breast Cancer class following surgery.  Patient was educated today on lymphedema risk reduction practices as it pertains to recommendations that will benefit the patient immediately following surgery.  She verbalized good understanding.    Physical Therapy Information for After Breast Cancer Surgery/Treatment:  Lymphedema is  a swelling condition that you may be at risk for in your arm if you have lymph nodes removed from the armpit area.  After a sentinel node biopsy, the risk is approximately 5-9% and is higher after an axillary node dissection.  There is treatment available for this condition and it is not life-threatening.  Contact your physician or physical therapist with concerns. You may begin the 4  shoulder/posture exercises (see additional sheet) when permitted by your physician (typically a week after surgery).  If you have drains, you may need to wait until those are removed before beginning range of motion exercises.  A general recommendation is to not lift your arms above shoulder height until drains are removed.  These exercises should be done to your tolerance and gently.  This is not a "no pain/no gain" type of recovery so listen to your body and stretch into the range of motion that you can tolerate, stopping if you have pain.  If you are having immediate reconstruction, ask your plastic surgeon about doing exercises as he or she may want you to wait. We encourage you to attend the free one time ABC (After Breast Cancer) class offered by [facility].  You will learn information related to lymphedema risk, prevention and treatment and additional exercises to regain mobility following surgery.  You can call [phone] for more information.  This is offered the 1st and 3rd Monday of each month.  You only attend the class one time. While undergoing any medical procedure or treatment, try to avoid blood pressure being taken or needle sticks from occurring on the arm on the side of cancer.   This recommendation begins after surgery and continues for the rest of your life.  This may help reduce your risk of getting lymphedema (swelling in your arm). An excellent resource for those seeking information on lymphedema is the National Lymphedema Network's web site. It can be accessed at www.lymphnet.org If you notice swelling in your hand, arm or breast at any time following surgery (even if it is many years from now), please contact your doctor or physical therapist to discuss this.  Lymphedema can be treated at any time but it is easier for you if it is treated early on.  If you feel like your shoulder motion is not returning to normal in a reasonable amount of time, please contact  your surgeon or physical therapist.  [facility] [phone]. [address], [address], [place] [address]  ABC CLASS After Breast Cancer Class  After Breast Cancer Class is a specially designed exercise class to assist you in a safe recover after having breast cancer surgery.  In this class you will learn how to get back to full function whether your drains were just removed or if you had surgery a month ago.  This one-time class is held the 1st and 3rd Monday of every month from 11:00 a.m. until 12:00 noon virtually.  This class is FREE and space is limited. For more information or to register for the next available class, call [phone].  Class Goals  Understand specific stretches to improve the flexibility of you chest and shoulder. Learn ways to safely strengthen your upper body and improve your posture. Understand the warning signs of infection and why you may be at risk for an arm infection. Learn about Lymphedema and prevention.  ** You do not attend this class until after surgery.  Drains must be removed to  participate  Patient was instructed today in a home exercise program today for post op shoulder range of motion. These included active assist shoulder flexion in sitting, scapular retraction, wall walking with shoulder abduction, and hands behind head external rotation.  She was encouraged to do these twice a day, holding 3 seconds and repeating 5 times when permitted by her physician.  Bethann Punches, [place] 06/04/23 3:43 PM

## 2023-06-04 NOTE — Assessment & Plan Note (Signed)
05/27/2023:Screening mammogram detected left breast asymmetry 12 o'clock position measuring 1.7 cm by ultrasound.  Ultrasound-guided biopsy revealed grade 3 IDC no LVI, ER 100%, PR 30%, Ki67 40%, HER2 negative  Pathology and radiology counseling:Discussed with the patient, the details of pathology including the type of breast cancer,the clinical staging, the significance of ER, PR and HER-2/neu receptors and the implications for treatment. After reviewing the pathology in detail, we proceeded to discuss the different treatment options between surgery, radiation, chemotherapy, antiestrogen therapies.  Recommendations: 1. Breast conserving surgery followed by 2. Oncotype DX testing to determine if chemotherapy would be of any benefit followed by 3. Adjuvant radiation therapy followed by 4. Adjuvant antiestrogen therapy  Oncotype counseling: I discussed Oncotype DX test. I explained to the patient that this is a 21 gene panel to evaluate patient tumors DNA to calculate recurrence score. This would help determine whether patient has high risk or low risk breast cancer. She understands that if her tumor was found to be high risk, she would benefit from systemic chemotherapy. If low risk, no need of chemotherapy.  Return to clinic after surgery to discuss final pathology report and then determine if Oncotype DX testing will need to be sent.

## 2023-06-04 NOTE — Progress Notes (Signed)
[facility] CONSULT NOTE  Patient Care Team: [name], MD as PCP - General (Family Medicine) Tracey Harries, MD as Consulting Physician (Obstetrics and Gynecology) Iva Boop, MD as Consulting Physician (Gastroenterology) Holli Humbles, MD as Consulting Physician (Ophthalmology) Aquilla Hacker, PA-C as Physician Assistant (Otolaryngology) Pershing Proud, RN as Oncology Nurse Navigator Donnelly Angelica, RN as Oncology Nurse Navigator Almond Lint, MD as Consulting Physician (General Surgery) [name], MD as Consulting Physician (Hematology and Oncology) Antony Blackbird, MD as Consulting Physician (Radiation Oncology)  CHIEF COMPLAINTS/PURPOSE OF CONSULTATION:  Newly diagnosed breast cancer  HISTORY OF PRESENTING ILLNESS:  [name] 57 y.o. female is here because of recent diagnosis of left breast cancer.  Patient had routine screening mammogram that detected left breast asymmetry at 12 o'clock position measuring 1.7 cm by ultrasound.  Ultrasound-guided biopsy revealed grade 3 invasive ductal carcinoma that was ER/PR positive HER2 negative with a Ki67 of 40%.   She was presented this morning in the multidisciplinary tumor board and she is here today with [facility] clinic to discuss her treatment plan accompanied by her husband.  I reviewed her records extensively and collaborated the history with the patient.  SUMMARY OF ONCOLOGIC HISTORY: Oncology History  Malignant neoplasm of upper-outer quadrant of left breast in female, estrogen receptor positive (HCC)  05/27/2023 Initial Diagnosis   Screening mammogram detected left breast asymmetry 12 o'clock position measuring 1.7 cm by ultrasound.  Ultrasound-guided biopsy revealed grade 3 IDC no LVI, ER 100%, PR 30%, Ki67 40%, HER2 negative   06/04/2023 Cancer Staging   Staging form: Breast, AJCC 8th Edition - Clinical: Stage IA (cT1c, cN0, cM0, G3, ER+, PR+, HER2-) - Signed by [name], MD on  06/04/2023 Stage prefix: Initial diagnosis Histologic grading system: 3 grade system      MEDICAL HISTORY:  Past Medical History:  Diagnosis Date   Allergy    seasonal allergies   Benign colon polyp 10/25/2020   07/2020, Dr. Leone Payor; q 10 yr recall   Esophageal reflux    hx of-with certain foods   Frequent UTI    Heart murmur    current    SURGICAL HISTORY: Past Surgical History:  Procedure Laterality Date   BREAST BIOPSY Left 05/27/2023   [place] LT BREAST BX W LOC DEV 1ST LESION IMG BX SPEC US GUIDE 05/27/2023 [facility] MAMMOGRAPHY   CHOLECYSTECTOMY     2008   DILATION AND CURETTAGE OF UTERUS     WISDOM TOOTH EXTRACTION      SOCIAL HISTORY: Social History   Socioeconomic History   Marital status: Married    Spouse name: Not on file   Number of children: 1   Years of education: Not on file   Highest education level: Not on file  Occupational History   Occupation: owns hair salon/hair stylist    Employer: [employer]  Tobacco Use   Smoking status: Never   Smokeless tobacco: Never  Vaping Use   Vaping status: Never Used  Substance and Sexual Activity   Alcohol use: Yes    Alcohol/week: 2.0 standard drinks of alcohol    Types: 2 Standard drinks or equivalent per week    Comment: occas   Drug use: No   Sexual activity: Yes    Birth control/protection: Post-menopausal  Other Topics Concern   Not on file  Social History Narrative   Not on file   Social Determinants of Health   Financial Resource Strain: Not on file  Food Insecurity: Not on file  Transportation Needs: Not on file  Physical Activity: Not on file  Stress: Not on file  Social Connections: Not on file  Intimate Partner Violence: Not on file    FAMILY HISTORY: Family History  Problem Relation Age of Onset   Diabetes Maternal Grandmother    Kidney disease Maternal Grandmother    Arthritis Mother    Hypertension Mother    Diabetes Mother    Hypertension Father    Healthy Sister    Healthy  Son    Heart disease Maternal Grandfather    Dementia Paternal Grandmother    Colon polyps Neg Hx    Colon cancer Neg Hx    Esophageal cancer Neg Hx    Rectal cancer Neg Hx    Stomach cancer Neg Hx     ALLERGIES:  has No Known Allergies.  MEDICATIONS:  No current outpatient medications on file.   No current facility-administered medications for this visit.    REVIEW OF SYSTEMS:   Constitutional: Denies fevers, chills or abnormal night sweats   All other systems were reviewed with the patient and are negative.  PHYSICAL EXAMINATION: ECOG PERFORMANCE STATUS: 1 - Symptomatic but completely ambulatory  Vitals:   06/04/23 1252  BP: (!) 141/83  Pulse: 76  Resp: 18  Temp: 97.9 F (36.6 C)  SpO2: 100%   Filed Weights   06/04/23 1252  Weight: 138 lb (62.6 kg)    GENERAL:alert, no distress and comfortable    LABORATORY DATA:  I have reviewed the data as listed Lab Results  Component Value Date   WBC 5.0 06/04/2023   HGB 14.4 06/04/2023   HCT 43.8 06/04/2023   MCV 90.5 06/04/2023   PLT 302 06/04/2023   Lab Results  Component Value Date   NA 142 06/04/2023   K 4.0 06/04/2023   CL 106 06/04/2023   CO2 31 06/04/2023    RADIOGRAPHIC STUDIES: I have personally reviewed the radiological reports and agreed with the findings in the report.  ASSESSMENT AND PLAN:  Malignant neoplasm of upper-outer quadrant of left breast in female, estrogen receptor positive (HCC) 05/27/2023:Screening mammogram detected left breast asymmetry 12 o'clock position measuring 1.7 cm by ultrasound.  Ultrasound-guided biopsy revealed grade 3 IDC no LVI, ER 100%, PR 30%, Ki67 40%, HER2 negative  Pathology and radiology counseling:Discussed with the patient, the details of pathology including the type of breast cancer,the clinical staging, the significance of ER, PR and HER-2/neu receptors and the implications for treatment. After reviewing the pathology in detail, we proceeded to discuss the  different treatment options between surgery, radiation, chemotherapy, antiestrogen therapies.  Recommendations: 1. Breast conserving surgery followed by 2. Oncotype DX testing to determine if chemotherapy would be of any benefit followed by 3. Adjuvant radiation therapy followed by 4. Adjuvant antiestrogen therapy  Oncotype counseling: I discussed Oncotype DX test. I explained to the patient that this is a 21 gene panel to evaluate patient tumors DNA to calculate recurrence score. This would help determine whether patient has high risk or low risk breast cancer. She understands that if her tumor was found to be high risk, she would benefit from systemic chemotherapy. If low risk, no need of chemotherapy.  Her son is an avid Teacher, English as a foreign language.  Her husband also enjoys golf. She works as a Interior and spatial designer. Return to clinic after surgery to discuss final pathology report and then determine if Oncotype DX testing will need to be sent.   All questions were answered. The patient  knows to call the clinic with any problems, questions or concerns.    Tamsen Meek, MD 06/04/23

## 2023-06-04 NOTE — Progress Notes (Signed)
[facility] Spiritual Care Note  Met with [name] and her husband [name] in Breast Multidisciplinary Clinic to introduce Support Center team/resources.  she completed SDOH screening; results follow below.    SDOH Screenings   Food Insecurity: No Food Insecurity (06/04/2023)  Housing: Low Risk  (06/04/2023)  Transportation Needs: No Transportation Needs (06/04/2023)  Utilities: Not At Risk (06/04/2023)  Depression (PHQ2-9): Low Risk  (11/15/2022)  Tobacco Use: Low Risk  (06/04/2023)    Chaplain and patient discussed common feelings and emotions when being diagnosed with cancer, and the importance of support during treatment.  Chaplain informed patient of the support team and support services at [facility].  Chaplain provided contact information and encouraged patient to call with any questions or concerns.  [name] reports good support and knows several people who have had breast cancer, who have been sources of support and encouragement.   Follow up needed: No. [name] is aware of ongoing Spiritual Care availability and plans to reach out as needed/desired.   [address] [place], [place], [facility] Pager [phone] Voicemail [phone]

## 2023-06-09 ENCOUNTER — Other Ambulatory Visit: Payer: Self-pay | Admitting: General Surgery

## 2023-06-09 DIAGNOSIS — C50412 Malignant neoplasm of upper-outer quadrant of left female breast: Secondary | ICD-10-CM

## 2023-06-11 ENCOUNTER — Other Ambulatory Visit: Payer: Self-pay | Admitting: General Surgery

## 2023-06-11 DIAGNOSIS — C50412 Malignant neoplasm of upper-outer quadrant of left female breast: Secondary | ICD-10-CM | POA: Diagnosis not present

## 2023-06-11 DIAGNOSIS — Z170 Estrogen receptor positive status [ER+]: Secondary | ICD-10-CM | POA: Diagnosis not present

## 2023-06-12 ENCOUNTER — Telehealth: Payer: Self-pay | Admitting: *Deleted

## 2023-06-12 ENCOUNTER — Encounter: Payer: Self-pay | Admitting: *Deleted

## 2023-06-12 NOTE — Telephone Encounter (Signed)
Spoke with patient to follow up from [facility] 9/11 and assess navigation needs. Patient denies any question or concerns at this time. Encouraged her to call should anything arise. Patient verbalized understanding.

## 2023-06-18 ENCOUNTER — Telehealth: Payer: Self-pay | Admitting: Hematology and Oncology

## 2023-06-18 NOTE — Telephone Encounter (Signed)
Patient is aware of scheduled appointment times/dates

## 2023-06-24 ENCOUNTER — Encounter (HOSPITAL_BASED_OUTPATIENT_CLINIC_OR_DEPARTMENT_OTHER): Payer: Self-pay | Admitting: General Surgery

## 2023-06-24 ENCOUNTER — Other Ambulatory Visit: Payer: Self-pay

## 2023-06-30 DIAGNOSIS — C50912 Malignant neoplasm of unspecified site of left female breast: Secondary | ICD-10-CM | POA: Diagnosis not present

## 2023-06-30 MED ORDER — CHLORHEXIDINE GLUCONATE CLOTH 2 % EX PADS: 6.0000 | MEDICATED_PAD | Freq: Once | CUTANEOUS | Status: DC

## 2023-06-30 NOTE — Progress Notes (Signed)
Patient given presurgical soap and presurgical drink. Education provided and patient verbalized understanding.

## 2023-07-01 ENCOUNTER — Ambulatory Visit
Admission: RE | Admit: 2023-07-01 | Discharge: 2023-07-01 | Disposition: A | Discharge status: Home / Self Care | Payer: BC Managed Care – PPO | Source: Ambulatory Visit | Attending: General Surgery | Admitting: General Surgery

## 2023-07-01 ENCOUNTER — Other Ambulatory Visit: Payer: Self-pay | Admitting: General Surgery

## 2023-07-01 ENCOUNTER — Inpatient Hospital Stay
Admission: RE | Admit: 2023-07-01 | Discharge: 2023-07-01 | Disposition: A | Discharge status: Home / Self Care | Payer: BC Managed Care – PPO | Source: Ambulatory Visit | Attending: General Surgery | Admitting: General Surgery

## 2023-07-01 DIAGNOSIS — C50412 Malignant neoplasm of upper-outer quadrant of left female breast: Secondary | ICD-10-CM

## 2023-07-01 DIAGNOSIS — C50812 Malignant neoplasm of overlapping sites of left female breast: Secondary | ICD-10-CM | POA: Diagnosis not present

## 2023-07-01 HISTORY — PX: BREAST BIOPSY: SHX20

## 2023-07-01 NOTE — H&P (Signed)
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 She did palpate a mass. She has no prior breast history. She does have a family history of pancreatic cancer in an uncle. She was seen by genetics but declined this. She underwent an evaluation that shows C density breast tissue. She had an asymmetry on the left breast to measure 1.7 cm in greatest dimension. Her lymph nodes were negative on ultrasound. Biopsy was a grade 3 invasive ductal carcinoma with no LVI that was 100% ER positive, 30% PR positive, HER2 negative, Ki-67 was 40%. She is here with her husband to discuss options.  Review of Systems: A complete review of systems was obtained from the patient. I have reviewed this information and discussed as appropriate with the patient. See HPI as well for other ROS.  Review of Systems  All other systems reviewed and are negative.  Medical History: Past Medical History:  Diagnosis Date  GERD (gastroesophageal reflux disease)  History of cancer   Patient Active Problem List  Diagnosis  Malignant neoplasm of upper-outer quadrant of left breast in female, estrogen receptor positive (CMS/HHS-HCC)   Past Surgical History:  Procedure Laterality Date  BREAST EXCISIONAL BIOPSY  05/27/23  CHOLECYSTECTOMY  D&C of uterus  wisdone teeth   No Known Allergies  No current outpatient medications on file prior to visit.   Family History  Problem Relation Age of Onset  Hyperlipidemia (Elevated cholesterol) Mother  Coronary Artery Disease (Blocked arteries around heart) Mother  Hyperlipidemia (Elevated cholesterol) Father    Social History   Tobacco Use  Smoking Status Never  Smokeless Tobacco Never Marital status: Married  Tobacco Use  Smoking status: Never  Smokeless tobacco: Never  Substance and Sexual Activity  Alcohol use: Yes  Drug use: Not Currently  Sexual activity: Never   Objective:   Vitals:  06/11/23 1046  BP: 116/88   Pulse: 88  Temp: 36.7 C (98.1 F)  SpO2: 99%  Weight: 62.6 kg (138 lb)  Height: 170.2 cm (5\' 7" )  PainSc: 0-No pain  PainLoc: Breast   Body mass index is 21.61 kg/m.  Physical Exam Vitals reviewed.  Constitutional:  Appearance: Normal appearance.  Chest:  Breasts: Right: No inverted nipple, mass or nipple discharge.  Left: Mass present. No inverted nipple or nipple discharge.  Comments: About 2 cm mass and hematoma Lymphadenopathy:  Upper Body:  Right upper body: No supraclavicular or axillary adenopathy.  Left upper body: No supraclavicular or axillary adenopathy.  Neurological:  Mental Status: She is alert.   Assessment and Plan:   Malignant neoplasm of upper-outer quadrant of left breast in female, estrogen receptor positive (CMS/HHS-HCC)  Left breast seed guided lumpectomy, left axillary sn biopsy  We discussed the staging and pathophysiology of breast cancer. We discussed all of the different options for treatment for breast cancer including surgery, chemotherapy, radiation therapy, Herceptin, and antiestrogen therapy.  We discussed genetic testing.   We discussed a sentinel lymph node biopsy as she does not appear to having lymph node involvement right now. We discussed the performance of that with injection of Magtrace. We discussed that there is a chance of having a positive node with a sentinel lymph node biopsy and we will await the permanent pathology to make any other first further decisions in terms of her treatment. We discussed up to a 5% risk lifetime of chronic shoulder pain as well as lymphedema associated with a sentinel lymph node biopsy.  We discussed the options for  treatment of the breast cancer which included lumpectomy versus a mastectomy. We discussed the performance of the lumpectomy with radioactive seed placement. We discussed a 5-10% chance of a positive margin requiring reexcision in the operating room. We also discussed that she will likely  need radiation therapy if she undergoes lumpectomy. We discussed mastectomy and the postoperative care for that as well. Mastectomy can be followed by reconstruction. The decision for lumpectomy vs mastectomy has no impact on decision for chemotherapy. Most mastectomy patients will not need radiation therapy. We discussed that there is no difference in her survival whether she undergoes lumpectomy with radiation therapy or antiestrogen therapy versus a mastectomy. There is also no real difference between her recurrence in the breast.  We discussed the risks of operation including bleeding, infection, possible reoperation. She understands her further therapy will be based on what her stages at the time of her operation.

## 2023-07-02 ENCOUNTER — Encounter (HOSPITAL_BASED_OUTPATIENT_CLINIC_OR_DEPARTMENT_OTHER): Payer: Self-pay | Admitting: General Surgery

## 2023-07-02 ENCOUNTER — Other Ambulatory Visit: Payer: Self-pay

## 2023-07-02 ENCOUNTER — Ambulatory Visit
Admission: RE | Admit: 2023-07-02 | Discharge: 2023-07-02 | Disposition: A | Discharge status: Home / Self Care | Payer: BC Managed Care – PPO | Source: Ambulatory Visit | Attending: General Surgery | Admitting: General Surgery

## 2023-07-02 ENCOUNTER — Ambulatory Visit (HOSPITAL_BASED_OUTPATIENT_CLINIC_OR_DEPARTMENT_OTHER): Payer: BC Managed Care – PPO | Admitting: Anesthesiology

## 2023-07-02 ENCOUNTER — Encounter (HOSPITAL_BASED_OUTPATIENT_CLINIC_OR_DEPARTMENT_OTHER)
Admission: RE | Disposition: A | Discharge status: Home / Self Care | Payer: Self-pay | Source: Home / Self Care | Attending: General Surgery

## 2023-07-02 ENCOUNTER — Ambulatory Visit (HOSPITAL_BASED_OUTPATIENT_CLINIC_OR_DEPARTMENT_OTHER)
Admission: RE | Admit: 2023-07-02 | Discharge: 2023-07-02 | Disposition: A | Discharge status: Home / Self Care | Payer: BC Managed Care – PPO | Attending: General Surgery | Admitting: General Surgery

## 2023-07-02 DIAGNOSIS — C50912 Malignant neoplasm of unspecified site of left female breast: Secondary | ICD-10-CM | POA: Diagnosis not present

## 2023-07-02 DIAGNOSIS — C50812 Malignant neoplasm of overlapping sites of left female breast: Secondary | ICD-10-CM | POA: Diagnosis not present

## 2023-07-02 DIAGNOSIS — C50412 Malignant neoplasm of upper-outer quadrant of left female breast: Secondary | ICD-10-CM | POA: Diagnosis not present

## 2023-07-02 DIAGNOSIS — Z9049 Acquired absence of other specified parts of digestive tract: Secondary | ICD-10-CM | POA: Insufficient documentation

## 2023-07-02 DIAGNOSIS — N6489 Other specified disorders of breast: Secondary | ICD-10-CM | POA: Diagnosis not present

## 2023-07-02 DIAGNOSIS — G8918 Other acute postprocedural pain: Secondary | ICD-10-CM | POA: Diagnosis not present

## 2023-07-02 DIAGNOSIS — Z01818 Encounter for other preprocedural examination: Secondary | ICD-10-CM

## 2023-07-02 DIAGNOSIS — Z170 Estrogen receptor positive status [ER+]: Secondary | ICD-10-CM | POA: Diagnosis not present

## 2023-07-02 HISTORY — PX: BREAST LUMPECTOMY WITH RADIOACTIVE SEED AND SENTINEL LYMPH NODE BIOPSY: SHX6550

## 2023-07-02 HISTORY — PX: BREAST LUMPECTOMY: SHX2

## 2023-07-02 SURGERY — BREAST LUMPECTOMY WITH RADIOACTIVE SEED AND SENTINEL LYMPH NODE BIOPSY
Anesthesia: Regional | Site: Breast | Laterality: Left

## 2023-07-02 MED ORDER — OXYCODONE HCL 5 MG PO TABS
5.0000 mg | ORAL_TABLET | Freq: Four times a day (QID) | ORAL | 0 refills | Status: DC | PRN
Start: 2023-07-02 — End: 2023-07-24

## 2023-07-02 MED ORDER — ONDANSETRON HCL 4 MG/2ML IJ SOLN
INTRAMUSCULAR | Status: AC
  Filled 2023-07-02: qty 2

## 2023-07-02 MED ORDER — ACETAMINOPHEN 500 MG PO TABS
1000.0000 mg | ORAL_TABLET | ORAL | Status: AC
  Administered 2023-07-02: 1000 mg via ORAL

## 2023-07-02 MED ORDER — CEFAZOLIN SODIUM-DEXTROSE 2-4 GM/100ML-% IV SOLN: 2.0000 g | INTRAVENOUS | Status: DC

## 2023-07-02 MED ORDER — PROPOFOL 10 MG/ML IV BOLUS
INTRAVENOUS | Status: DC | PRN
  Administered 2023-07-02: 200 mg via INTRAVENOUS

## 2023-07-02 MED ORDER — ONDANSETRON HCL 4 MG/2ML IJ SOLN
INTRAMUSCULAR | Status: DC | PRN
  Administered 2023-07-02: 4 mg via INTRAVENOUS

## 2023-07-02 MED ORDER — ACETAMINOPHEN 500 MG PO TABS
ORAL_TABLET | ORAL | Status: AC
  Filled 2023-07-02: qty 2

## 2023-07-02 MED ORDER — DEXAMETHASONE SODIUM PHOSPHATE 10 MG/ML IJ SOLN
INTRAMUSCULAR | Status: DC | PRN
Start: 2023-07-02 — End: 2023-07-02
  Administered 2023-07-02: 10 mg

## 2023-07-02 MED ORDER — CHLORHEXIDINE GLUCONATE CLOTH 2 % EX PADS: 6.0000 | MEDICATED_PAD | Freq: Once | CUTANEOUS | Status: DC

## 2023-07-02 MED ORDER — KETOROLAC TROMETHAMINE 15 MG/ML IJ SOLN
INTRAMUSCULAR | Status: AC
  Filled 2023-07-02: qty 1

## 2023-07-02 MED ORDER — MAGTRACE LYMPHATIC TRACER
INTRAMUSCULAR | Status: DC | PRN
  Administered 2023-07-02: 1.25 mL via INTRAMUSCULAR

## 2023-07-02 MED ORDER — CEFAZOLIN SODIUM-DEXTROSE 2-4 GM/100ML-% IV SOLN
INTRAVENOUS | Status: AC
  Filled 2023-07-02: qty 100

## 2023-07-02 MED ORDER — PROPOFOL 10 MG/ML IV BOLUS
INTRAVENOUS | Status: AC
  Filled 2023-07-02: qty 20

## 2023-07-02 MED ORDER — PROPOFOL 500 MG/50ML IV EMUL
INTRAVENOUS | Status: DC | PRN
Start: 2023-07-02 — End: 2023-07-02
  Administered 2023-07-02: 150 ug/kg/min via INTRAVENOUS

## 2023-07-02 MED ORDER — MIDAZOLAM HCL 2 MG/2ML IJ SOLN
2.0000 mg | Freq: Once | INTRAMUSCULAR | Status: AC
  Administered 2023-07-02: 2 mg via INTRAVENOUS

## 2023-07-02 MED ORDER — MIDAZOLAM HCL 2 MG/2ML IJ SOLN
INTRAMUSCULAR | Status: AC
  Filled 2023-07-02: qty 2

## 2023-07-02 MED ORDER — ROPIVACAINE HCL 5 MG/ML IJ SOLN
INTRAMUSCULAR | Status: DC | PRN
Start: 2023-07-02 — End: 2023-07-02
  Administered 2023-07-02: 30 mL via PERINEURAL

## 2023-07-02 MED ORDER — FENTANYL CITRATE (PF) 100 MCG/2ML IJ SOLN
INTRAMUSCULAR | Status: DC | PRN
  Administered 2023-07-02: 50 ug via INTRAVENOUS

## 2023-07-02 MED ORDER — KETOROLAC TROMETHAMINE 15 MG/ML IJ SOLN
15.0000 mg | INTRAMUSCULAR | Status: AC
  Administered 2023-07-02: 15 mg via INTRAVENOUS

## 2023-07-02 MED ORDER — LIDOCAINE 2% (20 MG/ML) 5 ML SYRINGE
INTRAMUSCULAR | Status: DC | PRN
  Administered 2023-07-02: 20 mg via INTRAVENOUS

## 2023-07-02 MED ORDER — FENTANYL CITRATE (PF) 100 MCG/2ML IJ SOLN
INTRAMUSCULAR | Status: AC
  Filled 2023-07-02: qty 2

## 2023-07-02 MED ORDER — DEXAMETHASONE SODIUM PHOSPHATE 10 MG/ML IJ SOLN
INTRAMUSCULAR | Status: DC | PRN
Start: 2023-07-02 — End: 2023-07-02
  Administered 2023-07-02: 10 mg via INTRAVENOUS

## 2023-07-02 MED ORDER — FENTANYL CITRATE (PF) 100 MCG/2ML IJ SOLN: 25.0000 ug | INTRAMUSCULAR | Status: DC | PRN

## 2023-07-02 MED ORDER — BUPIVACAINE HCL (PF) 0.25 % IJ SOLN
INTRAMUSCULAR | Status: DC | PRN
  Administered 2023-07-02: 5 mL

## 2023-07-02 MED ORDER — LACTATED RINGERS IV SOLN: INTRAVENOUS | Status: DC

## 2023-07-02 MED ORDER — CEFAZOLIN SODIUM-DEXTROSE 2-4 GM/100ML-% IV SOLN
2.0000 g | INTRAVENOUS | Status: AC
  Administered 2023-07-02: 2 g via INTRAVENOUS

## 2023-07-02 MED ORDER — FENTANYL CITRATE (PF) 100 MCG/2ML IJ SOLN
50.0000 ug | Freq: Once | INTRAMUSCULAR | Status: AC
  Administered 2023-07-02: 50 ug via INTRAVENOUS

## 2023-07-02 MED ORDER — ENSURE PRE-SURGERY PO LIQD: 296.0000 mL | Freq: Once | ORAL | Status: DC

## 2023-07-02 MED ORDER — ACETAMINOPHEN 500 MG PO TABS: 1000.0000 mg | ORAL_TABLET | ORAL | Status: DC

## 2023-07-02 SURGICAL SUPPLY — 63 items
ADH SKN CLS APL DERMABOND .7 (GAUZE/BANDAGES/DRESSINGS) ×1
APL PRP STRL LF DISP 70% ISPRP (MISCELLANEOUS) ×1
APPLIER CLIP 9.375 MED OPEN (MISCELLANEOUS)
APR CLP MED 9.3 20 MLT OPN (MISCELLANEOUS)
BINDER BREAST LRG (GAUZE/BANDAGES/DRESSINGS) IMPLANT
BINDER BREAST MEDIUM (GAUZE/BANDAGES/DRESSINGS) IMPLANT
BINDER BREAST XLRG (GAUZE/BANDAGES/DRESSINGS) IMPLANT
BINDER BREAST XXLRG (GAUZE/BANDAGES/DRESSINGS) IMPLANT
BLADE SURG 15 STRL LF DISP TIS (BLADE) ×2 IMPLANT
BLADE SURG 15 STRL SS (BLADE) ×1
CANISTER SUC SOCK COL 7IN (MISCELLANEOUS) IMPLANT
CANISTER SUCT 1200ML W/VALVE (MISCELLANEOUS) IMPLANT
CHLORAPREP W/TINT 26 (MISCELLANEOUS) ×2 IMPLANT
CLIP APPLIE 9.375 MED OPEN (MISCELLANEOUS) IMPLANT
CLIP TI WIDE RED SMALL 6 (CLIP) ×2 IMPLANT
COVER BACK TABLE 60X90IN (DRAPES) ×2 IMPLANT
COVER MAYO STAND STRL (DRAPES) ×2 IMPLANT
COVER PROBE CYLINDRICAL 5X96 (MISCELLANEOUS) ×2 IMPLANT
DERMABOND ADVANCED .7 DNX12 (GAUZE/BANDAGES/DRESSINGS) ×2 IMPLANT
DRAPE LAPAROSCOPIC ABDOMINAL (DRAPES) ×2 IMPLANT
DRAPE UTILITY XL STRL (DRAPES) ×2 IMPLANT
ELECT COATED BLADE 2.86 ST (ELECTRODE) ×2 IMPLANT
ELECT REM PT RETURN 9FT ADLT (ELECTROSURGICAL) ×1
ELECTRODE REM PT RTRN 9FT ADLT (ELECTROSURGICAL) ×2 IMPLANT
GLOVE BIO SURGEON STRL SZ7 (GLOVE) ×4 IMPLANT
GLOVE BIOGEL PI IND STRL 7.0 (GLOVE) IMPLANT
GLOVE BIOGEL PI IND STRL 7.5 (GLOVE) ×2 IMPLANT
GLOVE SURG SS PI 7.5 STRL IVOR (GLOVE) IMPLANT
GLOVE SURG SYN 7.5 E (GLOVE) ×1
GLOVE SURG SYN 7.5 PF PI (GLOVE) IMPLANT
GOWN SPEC L3 XXLG W/TWL (GOWN DISPOSABLE) IMPLANT
GOWN STRL REUS W/ TWL LRG LVL3 (GOWN DISPOSABLE) ×4 IMPLANT
GOWN STRL REUS W/ TWL XL LVL3 (GOWN DISPOSABLE) IMPLANT
GOWN STRL REUS W/TWL LRG LVL3 (GOWN DISPOSABLE) ×2
GOWN STRL REUS W/TWL XL LVL3 (GOWN DISPOSABLE) ×1
HEMOSTAT ARISTA ABSORB 3G PWDR (HEMOSTASIS) IMPLANT
KIT MARKER MARGIN INK (KITS) ×2 IMPLANT
NDL HYPO 25X1 1.5 SAFETY (NEEDLE) ×2 IMPLANT
NDL SAFETY ECLIPSE 18X1.5 (NEEDLE) IMPLANT
NEEDLE HYPO 25X1 1.5 SAFETY (NEEDLE) ×1
NS IRRIG 1000ML POUR BTL (IV SOLUTION) IMPLANT
PACK BASIN DAY SURGERY FS (CUSTOM PROCEDURE TRAY) ×2 IMPLANT
PENCIL SMOKE EVACUATOR (MISCELLANEOUS) ×2 IMPLANT
RETRACTOR ONETRAX LX 90X20 (MISCELLANEOUS) IMPLANT
SLEEVE SCD COMPRESS KNEE MED (STOCKING) ×2 IMPLANT
SPIKE FLUID TRANSFER (MISCELLANEOUS) IMPLANT
SPONGE T-LAP 4X18 ~~LOC~~+RFID (SPONGE) ×2 IMPLANT
STRIP CLOSURE SKIN 1/2X4 (GAUZE/BANDAGES/DRESSINGS) ×2 IMPLANT
SUT ETHILON 2 0 FS 18 (SUTURE) IMPLANT
SUT MNCRL AB 4-0 PS2 18 (SUTURE) ×2 IMPLANT
SUT MON AB 5-0 PS2 18 (SUTURE) IMPLANT
SUT SILK 2 0 SH (SUTURE) IMPLANT
SUT VIC AB 2-0 SH 27 (SUTURE) ×2
SUT VIC AB 2-0 SH 27XBRD (SUTURE) ×2 IMPLANT
SUT VIC AB 3-0 SH 27 (SUTURE) ×3
SUT VIC AB 3-0 SH 27X BRD (SUTURE) ×2 IMPLANT
SUT VIC AB 5-0 PS2 18 (SUTURE) IMPLANT
SYR CONTROL 10ML LL (SYRINGE) ×2 IMPLANT
TOWEL GREEN STERILE FF (TOWEL DISPOSABLE) ×2 IMPLANT
TRACER MAGTRACE VIAL (MISCELLANEOUS) IMPLANT
TRAY FAXITRON CT DISP (TRAY / TRAY PROCEDURE) ×2 IMPLANT
TUBE CONNECTING 20X1/4 (TUBING) IMPLANT
YANKAUER SUCT BULB TIP NO VENT (SUCTIONS) IMPLANT

## 2023-07-02 NOTE — Anesthesia Postprocedure Evaluation (Signed)
Anesthesia Post Note  Patient: Siani M Neis  Procedure(s) Performed: LEFT BREAST SEED LOCALIZED LUMPECTOMY WITH SENTINEL NODE BIOPSY (Left: Breast)     Patient location during evaluation: PACU Anesthesia Type: Regional and General Level of consciousness: awake and alert Pain management: pain level controlled Vital Signs Assessment: post-procedure vital signs reviewed and stable Respiratory status: spontaneous breathing, nonlabored ventilation, respiratory function stable and patient connected to nasal cannula oxygen Cardiovascular status: blood pressure returned to baseline and stable Postop Assessment: no apparent nausea or vomiting Anesthetic complications: no  No notable events documented.  Last Vitals:  Vitals:   07/02/23 1158 07/02/23 1213  BP: (!) 140/84 139/87  Pulse: 80 73  Resp: 17 16  Temp:  36.7 C  SpO2: 100% 99%    Last Pain:  Vitals:   07/02/23 1213  TempSrc:   PainSc: 0-No pain                 Everest Brod L Andris Brothers

## 2023-07-02 NOTE — Op Note (Signed)
Preoperative diagnosis: Clinical stage I left breast cancer Postoperative diagnosis: Same as above Procedure: 1.  Left breast radioactive seed guided lumpectomy 2.  Injection of mag trace for sentinel lymph node identification 3.  Left deep axillary sentinel lymph node biopsy Surgeon: Dr. Harden Mo Anesthesia: General With a pectoral block Estimated blood loss: Minimal Complications: None Drains: None Specimens: 1.  Left breast lumpectomy containing radioactive seed and clip marked with paint 2.  Additional medial, inferior, posterior margins marked short superior, long lateral, double deep 3.  Left deep axillary sentinel lymph nodes with highest count of 225 Sponge and needle count was correct completion Disposition recovery stable condition  Indications: This a 57 year old healthy female who presents after screening mammogram.  This showed a left breast asymmetry measuring 1.7 cm.  Her lymph nodes were negative on ultrasound.  Biopsy showed a grade 3 invasive ductal carcinoma that was ER/PR positive, HER2 negative.  We discussed all the options elected proceed with breast conservation therapy.  Procedure: After informed consent was obtained she first underwent a pectoral block.  She was given antibiotics.  SCDs were placed.  She was placed under general anesthesia without complication.  She was prepped and draped in standard sterile surgical fashion.  A surgical timeout was then performed.  I first injected 1.5 cc of mag trace in the subareolar position and massaged this for 5 minutes.  I then identified the radioactive seed in the upper central breast.  I made a periareolar incision in order to hide the scar later.  I then tunneled to the seed.  I remove the seed and the surrounding tissue with an attempt to get a clear margin.  I did a CT in the operating room and thought I was close to several margins and I excised these.  The margins appear clear upon completion.  I placed a couple  of clips in the cavity.  Hemostasis was obtained.  I closed down the breast tissue with 2-0 Vicryl.  The skin was closed with 3-0 Vicryl and 5-0 Monocryl.  Glue and Steri-Strips were eventually applied.  I then was able to identify some activity in the axilla.  I made an incision right below the axillary hairline.  I carried this to the axillary fascia.  I used the Sentimag probe to identify what appeared to be a couple sentinel lymph nodes.  These also appeared brown.  They were not enlarged.  I remove these and passed these off the table.  There was no other background activity or any palpable or brown nodes.  I obtained hemostasis.  I closed the axillary fascia with 2-0 Vicryl.  The skin was closed with 3-0 Vicryl for Monocryl.  Glue and Steri-Strips were applied.  She tolerated this well was extubated and transferred recovery stable.

## 2023-07-02 NOTE — Transfer of Care (Signed)
Immediate Anesthesia Transfer of Care Note  Patient: [name]  Procedure(s) Performed: LEFT BREAST SEED LOCALIZED LUMPECTOMY WITH SENTINEL NODE BIOPSY (Left: Breast)  Patient Location: PACU  Anesthesia Type:GA combined with regional for post-op pain  Level of Consciousness: drowsy and patient cooperative  Airway & Oxygen Therapy: Patient Spontanous Breathing and Patient connected to face mask oxygen  Post-op Assessment: Report given to RN and Post -op Vital signs reviewed and stable  Post vital signs: Reviewed and stable  Last Vitals:  Vitals Value Taken Time  BP    Temp    Pulse 100 07/02/23 1131  Resp 13 07/02/23 1131  SpO2 100 % 07/02/23 1131  Vitals shown include unfiled device data.  Last Pain:  Vitals:   07/02/23 0800  TempSrc: Temporal  PainSc: 0-No pain         Complications: No notable events documented.

## 2023-07-02 NOTE — Discharge Instructions (Addendum)
[facility] Office Phone Number [phone]  POST OP INSTRUCTIONS Take 400 mg of ibuprofen every 8 hours or 650 mg tylenol every 6 hours for next 72 hours then as needed. Use ice several times daily also.  A prescription for pain medication may be given to you upon discharge.  Take your pain medication as prescribed, if needed.  If narcotic pain medicine is not needed, then you may take acetaminophen (Tylenol), naprosyn (Alleve) or ibuprofen (Advil) as needed. Take your usually prescribed medications unless otherwise directed If you need a refill on your pain medication, please contact your pharmacy.  They will contact our office to request authorization.  Prescriptions will not be filled after 5pm or on week-ends. You should eat very light the first 24 hours after surgery, such as soup, crackers, pudding, etc.  Resume your normal diet the day after surgery. Most patients will experience some swelling and bruising in the breast.  Ice packs and a good support bra will help.  Wear the breast binder provided or a sports bra for 72 hours day and night.  After that wear a sports bra during the day until you return to the office. Swelling and bruising can take several days to resolve.  It is common to experience some constipation if taking pain medication after surgery.  Increasing fluid intake and taking a stool softener will usually help or prevent this problem from occurring.  A mild laxative (Milk of Magnesia or Miralax) should be taken according to package directions if there are no bowel movements after 48 hours. I used skin glue on the incision, you may shower in 24 hours.  The glue will flake off over the next 2-3 weeks.  Any sutures or staples will be removed at the office during your follow-up visit. ACTIVITIES:  You may resume regular daily activities (gradually increasing) beginning the next day.  Wearing a good support bra or sports bra minimizes pain and swelling.  You may have  sexual intercourse when it is comfortable. You may drive when you no longer are taking prescription pain medication, you can comfortably wear a seatbelt, and you can safely maneuver your car and apply brakes. RETURN TO WORK:  ______________________________________________________________________________________ [name] should see your doctor in the office for a follow-up appointment approximately two weeks after your surgery.  Your doctor's nurse will typically make your follow-up appointment when she calls you with your pathology report.  Expect your pathology report 3-4 business days after your surgery.  You may call to check if you do not hear from [place] after three days. OTHER INSTRUCTIONS: _______________________________________________________________________________________________ _____________________________________________________________________________________________________________________________________ _____________________________________________________________________________________________________________________________________ _____________________________________________________________________________________________________________________________________  WHEN TO CALL DR WAKEFIELD: Fever over 101.0 Nausea and/or vomiting. Extreme swelling or bruising. Continued bleeding from incision. Increased pain, redness, or drainage from the incision.  The clinic staff is available to answer your questions during regular business hours.  Please don't hesitate to call and ask to speak to one of the nurses for clinical concerns.  If you have a medical emergency, go to the nearest emergency room or call 911.  A surgeon from [facility] is always on call at the hospital.  For further questions, please visit [email or web address]   Post Anesthesia Home Care Instructions  Activity: Get plenty of rest for the remainder of the day. A responsible individual must stay  with you for 24 hours following the procedure.  For the next 24 hours, DO NOT: -Drive a car -Advertising copywriter -Drink alcoholic beverages -Take any medication unless instructed by  your physician -Make any legal decisions or sign important papers.  Meals: Start with liquid foods such as gelatin or soup. Progress to regular foods as tolerated. Avoid greasy, spicy, heavy foods. If nausea and/or vomiting occur, drink only clear liquids until the nausea and/or vomiting subsides. Call your physician if vomiting continues.  Special Instructions/Symptoms: Your throat may feel dry or sore from the anesthesia or the breathing tube placed in your throat during surgery. If this causes discomfort, gargle with warm salt water. The discomfort should disappear within 24 hours.  If you had a scopolamine patch placed behind your ear for the management of post- operative nausea and/or vomiting:  1. The medication in the patch is effective for 72 hours, after which it should be removed.  Wrap patch in a tissue and discard in the trash. Wash hands thoroughly with soap and water. 2. You may remove the patch earlier than 72 hours if you experience unpleasant side effects which may include dry mouth, dizziness or visual disturbances. 3. Avoid touching the patch. Wash your hands with soap and water after contact with the patch.     Regional Anesthesia Blocks  1. You may not be able to move or feel the "blocked" extremity after a regional anesthetic block. This may last may last from 3-48 hours after placement, but it will go away. The length of time depends on the medication injected and your individual response to the medication. As the nerves start to wake up, you may experience tingling as the movement and feeling returns to your extremity. If the numbness and inability to move your extremity has not gone away after 48 hours, please call your surgeon.   2. The extremity that is blocked will need to be protected  until the numbness is gone and the strength has returned. Because you cannot feel it, you will need to take extra care to avoid injury. Because it may be weak, you may have difficulty moving it or using it. You may not know what position it is in without looking at it while the block is in effect.  3. For blocks in the legs and feet, returning to weight bearing and walking needs to be done carefully. You will need to wait until the numbness is entirely gone and the strength has returned. You should be able to move your leg and foot normally before you try and bear weight or walk. You will need someone to be with you when you first try to ensure you do not fall and possibly risk injury.  4. Bruising and tenderness at the needle site are common side effects and will resolve in a few days.  5. Persistent numbness or new problems with movement should be communicated to the surgeon or the [facility] [phone] [facility] [phone]).  Next dose of tylenol not available until 2:10pm Next dose of ibuprofen not available until 4:10pm

## 2023-07-02 NOTE — Anesthesia Procedure Notes (Signed)
Procedure Name: LMA Insertion Date/Time: 07/02/2023 10:10 AM  Performed by: Thornell Mule, CRNAPre-anesthesia Checklist: Patient identified, Emergency Drugs available, Suction available and Patient being monitored Patient Re-evaluated:Patient Re-evaluated prior to induction Oxygen Delivery Method: Circle system utilized Preoxygenation: Pre-oxygenation with 100% oxygen Induction Type: IV induction LMA: LMA inserted LMA Size: 4.0 Number of attempts: 1 Placement Confirmation: positive ETCO2 Tube secured with: Tape Dental Injury: Teeth and Oropharynx as per pre-operative assessment

## 2023-07-02 NOTE — Interval H&P Note (Signed)
History and Physical Interval Note:  07/02/2023 7:55 AM  [name]  has presented today for surgery, with the diagnosis of LEFT BREAST CANCER.  The various methods of treatment have been discussed with the patient and family. After consideration of risks, benefits and other options for treatment, the patient has consented to  Procedure(s) with comments: LEFT BREAST SEED LOCALIZED LUMPECTOMY WITH SENTINEL NODE BIOPSY (Left) - LMA PEC BLOCK as a surgical intervention.  The patient's history has been reviewed, patient examined, no change in status, stable for surgery.  I have reviewed the patient's chart and labs.  Questions were answered to the patient's satisfaction.     Emelia Loron

## 2023-07-02 NOTE — Progress Notes (Signed)
Assisted Dr. Woodrum with left, pectoralis, ultrasound guided block. Side rails up, monitors on throughout procedure. See vital signs in flow sheet. Tolerated Procedure well. 

## 2023-07-02 NOTE — Anesthesia Preprocedure Evaluation (Addendum)
Anesthesia Evaluation  Patient identified by MRN, date of birth, ID band Patient awake    Reviewed: Allergy & Precautions, NPO status , Patient's Chart, lab work & pertinent test results  Airway Mallampati: I  TM Distance: >3 FB Neck ROM: Full    Dental no notable dental hx. (+) Teeth Intact, Dental Advisory Given   Pulmonary neg pulmonary ROS   Pulmonary exam normal breath sounds clear to auscultation       Cardiovascular negative cardio ROS Normal cardiovascular exam Rhythm:Regular Rate:Normal     Neuro/Psych negative neurological ROS  negative psych ROS   GI/Hepatic Neg liver ROS,GERD  ,,  Endo/Other  negative endocrine ROS    Renal/GU negative Renal ROS  negative genitourinary   Musculoskeletal negative musculoskeletal ROS (+)    Abdominal   Peds  Hematology negative hematology ROS (+)   Anesthesia Other Findings   Reproductive/Obstetrics                             Anesthesia Physical Anesthesia Plan  ASA: 2  Anesthesia Plan: General and Regional   Post-op Pain Management: Regional block* and Tylenol PO (pre-op)*   Induction: Intravenous  PONV Risk Score and Plan: 3 and Ondansetron, Dexamethasone and Midazolam  Airway Management Planned: LMA  Additional Equipment:   Intra-op Plan:   Post-operative Plan: Extubation in OR  Informed Consent: I have reviewed the patients History and Physical, chart, labs and discussed the procedure including the risks, benefits and alternatives for the proposed anesthesia with the patient or authorized representative who has indicated his/her understanding and acceptance.     Dental advisory given  Plan Discussed with: CRNA  Anesthesia Plan Comments:        Anesthesia Quick Evaluation

## 2023-07-02 NOTE — Interval H&P Note (Signed)
History and Physical Interval Note:  07/02/2023 9:48 AM  [name]  has presented today for surgery, with the diagnosis of LEFT BREAST CANCER.  The various methods of treatment have been discussed with the patient and family. After consideration of risks, benefits and other options for treatment, the patient has consented to  Procedure(s) with comments: LEFT BREAST SEED LOCALIZED LUMPECTOMY WITH SENTINEL NODE BIOPSY (Left) - LMA PEC BLOCK as a surgical intervention.  The patient's history has been reviewed, patient examined, no change in status, stable for surgery.  I have reviewed the patient's chart and labs.  Questions were answered to the patient's satisfaction.     Emelia Loron

## 2023-07-02 NOTE — Anesthesia Procedure Notes (Signed)
Anesthesia Regional Block: Pectoralis block   Pre-Anesthetic Checklist: , timeout performed,  Correct Patient, Correct Site, Correct Laterality,  Correct Procedure, Correct Position, site marked,  Risks and benefits discussed,  Pre-op evaluation,  At surgeon's request and post-op pain management  Laterality: Left  Prep: Maximum Sterile Barrier Precautions used, chloraprep       Needles:  Injection technique: Single-shot  Needle Type: Echogenic Stimulator Needle     Needle Length: 9cm  Needle Gauge: 21     Additional Needles:   Procedures:,,,, ultrasound used (permanent image in chart),,    Narrative:  Start time: 07/02/2023 9:00 AM End time: 07/02/2023 9:05 AM Injection made incrementally with aspirations every 5 mL. Anesthesiologist: Elmer Picker, MD

## 2023-07-03 ENCOUNTER — Encounter (HOSPITAL_BASED_OUTPATIENT_CLINIC_OR_DEPARTMENT_OTHER): Payer: Self-pay | Admitting: General Surgery

## 2023-07-03 LAB — SURGICAL PATHOLOGY

## 2023-07-08 ENCOUNTER — Encounter: Payer: Self-pay | Admitting: *Deleted

## 2023-07-08 ENCOUNTER — Telehealth: Payer: Self-pay | Admitting: *Deleted

## 2023-07-08 NOTE — Telephone Encounter (Signed)
Received order for oncotype testing. Requisition sent to pathology 

## 2023-07-21 ENCOUNTER — Ambulatory Visit: Payer: BC Managed Care – PPO | Attending: General Surgery | Admitting: Physical Therapy

## 2023-07-21 ENCOUNTER — Encounter: Payer: Self-pay | Admitting: Physical Therapy

## 2023-07-21 DIAGNOSIS — Z483 Aftercare following surgery for neoplasm: Secondary | ICD-10-CM | POA: Insufficient documentation

## 2023-07-21 DIAGNOSIS — R293 Abnormal posture: Secondary | ICD-10-CM | POA: Diagnosis not present

## 2023-07-21 DIAGNOSIS — Z170 Estrogen receptor positive status [ER+]: Secondary | ICD-10-CM | POA: Insufficient documentation

## 2023-07-21 DIAGNOSIS — C50412 Malignant neoplasm of upper-outer quadrant of left female breast: Secondary | ICD-10-CM | POA: Insufficient documentation

## 2023-07-21 NOTE — Patient Instructions (Signed)
Closed Chain: Shoulder Abduction / Adduction - on Wall    One hand on wall, step to side and return. Stepping causes shoulder to abduct and adduct. Step __5_ times, holding 5 seconds __2_ times per day.  http://ss.exer.us/267   Copyright  VHI. All rights reserved.  Closed Chain: Shoulder Flexion / Extension - on Wall    Hands on wall, step backward. Return. Stepping causes shoulder flexion and extension Step __5_ times, holding 5 seconds __2_ times per day.  http://ss.exer.us/265   Copyright  VHI. All rights reserved.   [facility]  [address]  [place] [address]  [phone]  After Breast Cancer Class It is recommended you attend the ABC class to be educated on lymphedema risk reduction. This class is free of charge and lasts for 1 hour. It is a 1-time class. You will need to download the TEAMS app either on your phone or computer. We will send you a link the night before or the morning of the class. You should be able to click on that link to join the class. This is not a confidential class. You don't have to turn your camera on, but other participants may be able to see your email address. You're scheduled for Nov. 18, 2024 at 12:00.  Scar massage You can begin gentle scar massage to you incision sites. Gently place one hand on the incision and move the skin (without sliding on the skin) in various directions. Do this for a few minutes and then you can gently massage either coconut oil or vitamin E cream into the scars.  Compression garment You should continue wearing your compression bra until you feel like you no longer have swelling.  Home exercise Program Continue doing the exercises you were given until you feel like you can do them without feeling any tightness at the end.   Walking Program Studies show that 30 minutes of walking per day (fast enough to elevate your heart rate) can significantly reduce the risk of a cancer  recurrence. If you can't walk due to other medical reasons, we encourage you to find another activity you could do (like a stationary bike or water exercise).  Posture After breast cancer surgery, people frequently sit with rounded shoulders posture because it puts their incisions on slack and feels better. If you sit like this and scar tissue forms in that position, you can become very tight and have pain sitting or standing with good posture. Try to be aware of your posture and sit and stand up tall to heal properly.  Follow up PT: It is recommended you return every 3 months for the first 3 years following surgery to be assessed on the SOZO machine for an L-Dex score. This helps prevent clinically significant lymphedema in 95% of patients. These follow up screens are 10 minute appointments that you are not billed for.

## 2023-07-21 NOTE — Therapy (Signed)
OUTPATIENT PHYSICAL THERAPY BREAST CANCER POST OP FOLLOW UP   Patient Name: [name] MRN: [id number] DOB:08/07/1966, 57 y.o., female Today's Date: 07/21/2023  END OF SESSION:  PT End of Session - 07/21/23 1408     Visit Number 2    Number of Visits 3    Date for PT Re-Evaluation 08/18/23    PT Start Time 1405    PT Stop Time 1458    PT Time Calculation (min) 53 min    Activity Tolerance Patient tolerated treatment well    Behavior During Therapy [facility] for tasks assessed/performed             Past Medical History:  Diagnosis Date   Allergy    seasonal allergies   Benign colon polyp 10/25/2020   07/2020, Dr. Leone Payor; q 10 yr recall   Esophageal reflux    hx of-with certain foods   Frequent UTI    Heart murmur    current   Past Surgical History:  Procedure Laterality Date   BREAST BIOPSY Left 05/27/2023   [place] LT BREAST BX W LOC DEV 1ST LESION IMG BX SPEC US GUIDE 05/27/2023 [facility] MAMMOGRAPHY   BREAST BIOPSY Left 07/01/2023   [place] LT RADIOACTIVE SEED LOC 07/01/2023 [facility] ULTRASOUND   BREAST LUMPECTOMY WITH RADIOACTIVE SEED AND SENTINEL LYMPH NODE BIOPSY Left 07/02/2023   Procedure: LEFT BREAST SEED LOCALIZED LUMPECTOMY WITH SENTINEL NODE BIOPSY;  Surgeon: Emelia Loron, MD;  Location: [facility];  Service: General;  Laterality: Left;  LMA PEC BLOCK   CHOLECYSTECTOMY     2008   DILATION AND CURETTAGE OF UTERUS     WISDOM TOOTH EXTRACTION     Patient Active Problem List   Diagnosis Date Noted   Malignant neoplasm of upper-outer quadrant of left breast in female, estrogen receptor positive (HCC) 06/02/2023   Benign colon polyp 10/25/2020   Secondary insomnia 10/25/2020   Multinodular goiter 10/12/2020   GERD (gastroesophageal reflux disease) 07/28/2008    REFERRING PROVIDER: Dr. Emelia Loron (did surgery)/ Dr. Almond Lint (initially referred pt)  REFERRING DIAG: Left breast cancer  THERAPY DIAG:  Malignant neoplasm of upper-outer  quadrant of left breast in female, estrogen receptor positive (HCC)  Abnormal posture  Aftercare following surgery for neoplasm  Rationale for Evaluation and Treatment: Rehabilitation  ONSET DATE: 07/02/2023  SUBJECTIVE:                                                                                                                                                                                           SUBJECTIVE STATEMENT: Patient reports she underwent a left lumpectomy and sentinel node biopsy (  1 negative node) on 07/02/2023. Her Oncotype has been ordered but results are not yet back to determine next steps.Marland Kitchen  PERTINENT HISTORY:  Patient was diagnosed on 05/28/2023 with left grade III invasive ductal carcinoma breast cancer. She underwent a left lumpectomy and sentinel node biopsy (1 negative node) on 07/02/2023. It is ER/PR positive and HER2 negative with a Ki67 of 40%.   PATIENT GOALS:  Reassess how my recovery is going related to arm function, pain, and swelling.  PAIN:  Are you having pain? No  PRECAUTIONS: Recent Surgery, left UE Lymphedema risk  RED FLAGS: None   ACTIVITY LEVEL / LEISURE: She is doing her HEP and has tried returning to walking but that was uncomfortable in her breast.   OBJECTIVE:   PATIENT SURVEYS:  QUICK DASH:  Quick Dash - 07/21/23 0001     Open a tight or new jar Mild difficulty    Do heavy household chores (wash walls, wash floors) Mild difficulty    Carry a shopping bag or briefcase Mild difficulty    Wash your back Mild difficulty    Use a knife to cut food No difficulty    Recreational activities in which you take some force or impact through your arm, shoulder, or hand (golf, hammering, tennis) Moderate difficulty    During the past week, to what extent has your arm, shoulder or hand problem interfered with your normal social activities with family, friends, neighbors, or groups? Slightly    During the past week, to what extent has your arm,  shoulder or hand problem limited your work or other regular daily activities Slightly    Arm, shoulder, or hand pain. Mild    Tingling (pins and needles) in your arm, shoulder, or hand Moderate    Difficulty Sleeping No difficulty    DASH Score 25 %              OBSERVATIONS: Significant hematoma present in left breast with hardness present. No signs of infection.   POSTURE:  Forward head and rounded shoulders  LYMPHEDEMA ASSESSMENT:   UPPER EXTREMITY AROM/PROM:   A/PROM RIGHT   eval    Shoulder extension 52  Shoulder flexion 153  Shoulder abduction 157  Shoulder internal rotation 64  Shoulder external rotation 88                          (Blank rows = not tested)   A/PROM LEFT   eval LEFT 07/21/2023  Shoulder extension 50 47  Shoulder flexion 140 120  Shoulder abduction 163 115  Shoulder internal rotation 78 75  Shoulder external rotation 88 87                          (Blank rows = not tested)   CERVICAL AROM: All within normal limits   UPPER EXTREMITY STRENGTH: WNL   LYMPHEDEMA ASSESSMENTS (in cm):    LANDMARK RIGHT   eval RIGHT 07/21/2023  10 cm proximal to olecranon process 25.3 24.9  Olecranon process 22.7 22.3  10 cm proximal to ulnar styloid process 19.5 18.7  Just proximal to ulnar styloid process 14.6 14.3  Across hand at thumb web space 17.5 17.6  At base of 2nd digit 6 5.8  (Blank rows = not tested)   LANDMARK LEFT   eval LEFT 07/21/2023  10 cm proximal to olecranon process 24.7 24.4  Olecranon process 22.2 22.1  10 cm proximal to  ulnar styloid process 19.1 18.3  Just proximal to ulnar styloid process 14.3 14.1  Across hand at thumb web space 18 16.9  At base of 2nd digit 6 5.8  (Blank rows = not tested) Surgery type/Date: Left lumpectomy and sentinel node biopsy 07/02/2023 Number of lymph nodes removed: 1 Current/past treatment (chemo, radiation, hormone therapy): none Other symptoms:  Heaviness/tightness No Pain No Pitting  edema No Infections No Decreased scar mobility No Stemmer sign No  PATIENT EDUCATION:  Education details: HEP Person educated: Patient Education method: Explanation Education comprehension: verbalized understanding  HOME EXERCISE PROGRAM: Reviewed previously given post op HEP.  ASSESSMENT:  CLINICAL IMPRESSION: Patient is doing well s/p left lumpectomy and sentinel node biopsy 07/02/2023 but has some discomfort with walking and daily activities due to her hematoma in her left breast (see photo). Her left shoulder ROM is limited which she attributes to being fearful of "messing something up". We discussed the safety of moving her arm through painfree ROM and that feeling a good stretch is safe. She will benefit from PT if her shoulder ROM does not return to baseline in 2 weeks. She wants to try the 2 new exercises for 2 weeks and see if she can regain ROM independently which is reasonable.  Pt will benefit from skilled therapeutic intervention to improve on the following deficits: Decreased knowledge of precautions, impaired UE functional use, pain, decreased ROM, postural dysfunction.   PT treatment/interventions: ADL/Self care home management, [id number]- PT Re-evaluation, 97110-Therapeutic exercises, 97530- Therapeutic activity, 97535- Self Care, 09811- Manual therapy, Patient/Family education, Manual lymph drainage, and Scar mobilization   GOALS: Goals reviewed with patient? Yes  LONG TERM GOALS:  (STG=LTG)  GOALS Name Target Date  Goal status  1 Pt will demonstrate she has regained full shoulder ROM and function post operatively compared to baselines.  Baseline: 08/18/2023 ONGOING     PLAN:  PT FREQUENCY/DURATION: 1 additional visit to determine if she is back to baseline; if not will re-cert to begin PT  PLAN FOR NEXT SESSION: Pt will return in 2 weeks to determine if she needs PT for if ROM has returned to baseline.   [facility]  [address]  [place] [address]  [phone]  After Breast Cancer Class It is recommended you attend the ABC class to be educated on lymphedema risk reduction. This class is free of charge and lasts for 1 hour. It is a 1-time class. You will need to download the TEAMS app either on your phone or computer. We will send you a link the night before or the morning of the class. You should be able to click on that link to join the class. This is not a confidential class. You don't have to turn your camera on, but other participants may be able to see your email address. You're scheduled for Nov. 18, 2024 at 12:00.  Scar massage You can begin gentle scar massage to you incision sites. Gently place one hand on the incision and move the skin (without sliding on the skin) in various directions. Do this for a few minutes and then you can gently massage either coconut oil or vitamin E cream into the scars.  Compression garment You should continue wearing your compression bra until you feel like you no longer have swelling.  Home exercise Program Continue doing the exercises you were given until you feel like you can do them without feeling any tightness at the end.   Walking Program  Studies show that 30 minutes of walking per day (fast enough to elevate your heart rate) can significantly reduce the risk of a cancer recurrence. If you can't walk due to other medical reasons, we encourage you to find another activity you could do (like a stationary bike or water exercise).  Posture After breast cancer surgery, people frequently sit with rounded shoulders posture because it puts their incisions on slack and feels better. If you sit like this and scar tissue forms in that position, you can become very tight and have pain sitting or standing with good posture. Try to be aware of your posture and sit and stand up tall to heal properly.  Follow up PT: It is recommended you return every 3 months for the first 3  years following surgery to be assessed on the SOZO machine for an L-Dex score. This helps prevent clinically significant lymphedema in 95% of patients. These follow up screens are 10 minute appointments that you are not billed for.  Bethann Punches, [place] 07/21/23 3:02 PM

## 2023-07-23 ENCOUNTER — Encounter: Payer: Self-pay | Admitting: *Deleted

## 2023-07-23 ENCOUNTER — Telehealth: Payer: Self-pay | Admitting: *Deleted

## 2023-07-23 DIAGNOSIS — C50412 Malignant neoplasm of upper-outer quadrant of left female breast: Secondary | ICD-10-CM | POA: Diagnosis not present

## 2023-07-23 DIAGNOSIS — Z170 Estrogen receptor positive status [ER+]: Secondary | ICD-10-CM | POA: Diagnosis not present

## 2023-07-23 NOTE — Telephone Encounter (Signed)
Received oncotype results of 20/6%. Referral placed for Dr. Roselind Messier.

## 2023-07-24 ENCOUNTER — Inpatient Hospital Stay: Payer: BC Managed Care – PPO | Attending: Hematology and Oncology | Admitting: Hematology and Oncology

## 2023-07-24 ENCOUNTER — Other Ambulatory Visit: Payer: Self-pay

## 2023-07-24 VITALS — BP 135/79 | HR 75 | Temp 97.5°F | Resp 18 | Ht 67.0 in | Wt 135.4 lb

## 2023-07-24 DIAGNOSIS — Z170 Estrogen receptor positive status [ER+]: Secondary | ICD-10-CM | POA: Diagnosis not present

## 2023-07-24 DIAGNOSIS — Z1732 Human epidermal growth factor receptor 2 negative status: Secondary | ICD-10-CM | POA: Diagnosis not present

## 2023-07-24 DIAGNOSIS — Z1721 Progesterone receptor positive status: Secondary | ICD-10-CM | POA: Diagnosis not present

## 2023-07-24 DIAGNOSIS — C50412 Malignant neoplasm of upper-outer quadrant of left female breast: Secondary | ICD-10-CM | POA: Diagnosis not present

## 2023-07-24 NOTE — Progress Notes (Signed)
Patient Care Team: [name], MD as PCP - General (Family Medicine) Tracey Harries, MD as Consulting Physician (Obstetrics and Gynecology) Iva Boop, MD as Consulting Physician (Gastroenterology) Holli Humbles, MD as Consulting Physician (Ophthalmology) Aquilla Hacker, PA-C as Physician Assistant (Otolaryngology) Pershing Proud, RN as Oncology Nurse Navigator Donnelly Angelica, RN as Oncology Nurse Navigator Almond Lint, MD as Consulting Physician (General Surgery) [name], MD as Consulting Physician (Hematology and Oncology) Antony Blackbird, MD as Consulting Physician (Radiation Oncology)  DIAGNOSIS:  Encounter Diagnosis  Name Primary?   Malignant neoplasm of upper-outer quadrant of left breast in female, estrogen receptor positive (HCC) Yes    SUMMARY OF ONCOLOGIC HISTORY: Oncology History  Malignant neoplasm of upper-outer quadrant of left breast in female, estrogen receptor positive (HCC)  05/27/2023 Initial Diagnosis   Screening mammogram detected left breast asymmetry 12 o'clock position measuring 1.7 cm by ultrasound.  Ultrasound-guided biopsy revealed grade 3 IDC no LVI, ER 100%, PR 30%, Ki67 40%, HER2 negative   06/04/2023 Cancer Staging   Staging form: Breast, AJCC 8th Edition - Clinical: Stage IA (cT1c, cN0, cM0, G3, ER+, PR+, HER2-) - Signed by [name], MD on 06/04/2023 Stage prefix: Initial diagnosis Histologic grading system: 3 grade system   07/02/2023 Surgery   Left lumpectomy: Grade 3 IDC 1.4 cm with intermediate grade DCIS, margins negative, LVI not identified, ER 100%, PR 30%, HER2 -0, Ki-67 40% 0/1 lymph node negative     CHIEF COMPLIANT: Follow-up after recent breast surgery  History of Present Illness   The patient presents for a post-operative follow-up after a recent surgery for breast cancer. She reports that the surgery went well and she experienced minimal pain, not requiring any additional pain medication beyond Tylenol  and ibuprofen. She was informed that the final pathology report confirmed a 1.4 cm tumor, which was completely removed with negative margins. The tumor was grade 3, 100% estrogen receptor positive, 30% progesterone positive, HER2 negative, and had a growth rate of 40%. The patient expresses relief and happiness at the news, having been worried about the possibility of needing chemotherapy.        ALLERGIES:  has No Known Allergies.  MEDICATIONS:  No current outpatient medications on file.   No current facility-administered medications for this visit.    PHYSICAL EXAMINATION: ECOG PERFORMANCE STATUS: 1 - Symptomatic but completely ambulatory  Vitals:   07/24/23 1317  BP: 135/79  Pulse: 75  Resp: 18  Temp: (!) 97.5 F (36.4 C)  SpO2: 100%   Filed Weights   07/24/23 1317  Weight: 135 lb 6.4 oz (61.4 kg)     LABORATORY DATA:  I have reviewed the data as listed    Latest Ref Rng & Units 06/04/2023   12:28 PM 07/04/2021   11:20 AM 05/18/2020    9:13 AM  CMP  Glucose 70 - 99 mg/dL 93  70  80   BUN 6 - 20 mg/dL 9  9  10    Creatinine 0.44 - 1.00 mg/dL 1.61  0.96  0.45   Sodium 135 - 145 mmol/L 142  140  141   Potassium 3.5 - 5.1 mmol/L 4.0  4.0  4.8   Chloride 98 - 111 mmol/L 106  103  102   CO2 22 - 32 mmol/L 31  31  28    Calcium 8.9 - 10.3 mg/dL 9.9  9.8  9.5   Total Protein 6.5 - 8.1 g/dL 7.7  7.5  7.1   Total Bilirubin  0.3 - 1.2 mg/dL 0.5  0.5  0.4   Alkaline Phos 38 - 126 U/L 89  88  97   AST 15 - 41 U/L 16  17  20    ALT 0 - 44 U/L 18  17  21      Lab Results  Component Value Date   WBC 5.0 06/04/2023   HGB 14.4 06/04/2023   HCT 43.8 06/04/2023   MCV 90.5 06/04/2023   PLT 302 06/04/2023   NEUTROABS 2.7 06/04/2023    ASSESSMENT & PLAN:  Malignant neoplasm of upper-outer quadrant of left breast in female, estrogen receptor positive (HCC) 05/27/2023:Screening mammogram detected left breast asymmetry 12 o'clock position measuring 1.7 cm by ultrasound.   Ultrasound-guided biopsy revealed grade 3 IDC no LVI, ER 100%, PR 30%, Ki67 40%, HER2 negative   07/02/2023: Left lumpectomy: Grade 3 IDC 1.4 cm with intermediate grade DCIS, margins negative, LVI not identified, ER 100%, PR 30%, HER2 -0, Ki-67 40% 0/1 lymph node negative  Pathology counseling: I discussed the final pathology report of the patient provided  a copy of this report. I discussed the margins as well as lymph node surgeries. We also discussed the final staging along with previously performed ER/PR and HER-2/neu testing.  Treatment plan: Oncotype DX testing to determine if chemotherapy would be of any benefit followed by Adjuvant radiation therapy followed by Adjuvant antiestrogen therapy  Her son is an avid golfer.  Her husband also enjoys golf. She works as a Interior and spatial designer. Return to clinic based upon Oncotype DX test result ------------------------------------- Assessment and Plan    Breast Cancer Successful surgery with clear margins and no lymphovascular invasion. Oncotype score of 20, indicating a low risk of recurrence and no benefit from chemotherapy. - Proceed with radiation therapy as planned. - Discuss hormone therapy options after completion of radiation therapy.    Follow-up - Schedule appointment for the last day or two of radiation therapy to discuss hormone therapy options and assess post-radiation status.        No orders of the defined types were placed in this encounter.  The patient has a good understanding of the overall plan. she agrees with it. she will call with any problems that may develop before the next visit here. Total time spent: 30 mins including face to face time and time spent for planning, charting and co-ordination of care   Tamsen Meek, MD 07/24/23

## 2023-07-24 NOTE — Assessment & Plan Note (Signed)
05/27/2023:Screening mammogram detected left breast asymmetry 12 o'clock position measuring 1.7 cm by ultrasound.  Ultrasound-guided biopsy revealed grade 3 IDC no LVI, ER 100%, PR 30%, Ki67 40%, HER2 negative   07/02/2023: Left lumpectomy: Grade 3 IDC 1.4 cm with intermediate grade DCIS, margins negative, LVI not identified, ER 100%, PR 30%, HER2 -0, Ki-67 40% 0/1 lymph node negative  Pathology counseling: I discussed the final pathology report of the patient provided  a copy of this report. I discussed the margins as well as lymph node surgeries. We also discussed the final staging along with previously performed ER/PR and HER-2/neu testing.  Treatment plan: Oncotype DX testing to determine if chemotherapy would be of any benefit followed by Adjuvant radiation therapy followed by Adjuvant antiestrogen therapy  Her son is an avid golfer.  Her husband also enjoys golf. She works as a Interior and spatial designer. Return to clinic based upon Oncotype DX test result

## 2023-07-30 NOTE — Progress Notes (Signed)
Location of Breast Cancer:{:21944} ***  Histology per Pathology Report: ***  Receptor Status: ER(***), PR (***), Her2-neu (***), Ki-67(***)  Did patient present with symptoms (if so, please note symptoms) or was this found on screening mammography?: ***  Past/Anticipated interventions by surgeon, if any:{t:21944} ***  Past/Anticipated interventions by medical oncology, if any: Chemotherapy ***  Lymphedema issues, if any:  {:18581} {t:21944}   Pain issues, if any:  {:18581} {PAIN DESCRIPTION:21022940}  SAFETY ISSUES: Prior radiation? {:18581} Pacemaker/ICD? {:18581} Possible current pregnancy?{:18581} Is the patient on methotrexate? {:18581}  Current Complaints / other details:  ***

## 2023-07-30 NOTE — Progress Notes (Signed)
Radiation Oncology         [phone] ________________________________  Name: [name] MRN: [id number]  Date: 07/31/2023  DOB: May 06, 1966  Re-Evaluation Note  CC: [name], MD  [name], MD  No diagnosis found.  Diagnosis: Stage IA (cT1c, cN0, cM0) Left Breast UOQ, Invasive ductal carcinoma with intermediate grade DCIS, ER+ / PR+ / Her2-, Grade 3 : s/p left breast lumpectomy and SLN biopsies with clear margins and negative nodes  Narrative:  The patient returns today to discuss radiation treatment options. She was seen in the multidisciplinary breast clinic on 06/04/23.  Since her consultation date, she opted to proceed with a left breast lumpectomy with left axillary SLN biopsies on 07/02/23 under the care of Dr. Dwain Sarna. Pathology from the procedure revealed: tumor the size of 1.4 cm; histology of grade 3 invasive ductal carcinoma with intermediate grade DCIS; all margins negative for invasive and in situ carcinoma; nodal status of 1/1 left axillary SLN negative for metastatic carcinoma. Prognostic indicators significant for: estrogen receptor 100% positive with strong staining intensity, and progesterone receptor 30% positive with weak-strong staining intensity; Proliferation marker Ki67 at 40%; Her2 status negative; Grade 3.   Oncotype DX was obtained on the final surgical sample and the recurrence score of 20 predicts a risk of recurrence outside the breast over the next 9 years of 6%, if the patient's only systemic therapy is an antiestrogen for 5 years.  It also predicts no significant benefit from chemotherapy.  Based on negative Oncotype results, she will not require chemotherapy and will proceed with antiestrogen therapy after completing XRT. She will follow up with Dr. Pamelia Hoit near the end of XRT to discuss antiestrogen treatment options in more detail.   Post-operatively, she did develop a moderate hematoma which is very similar to the biopsy site hematoma  that she had previously. She is otherwise healing well from her procedure and remains without signs of infection or seroma.   On review of systems, the patient reports ***. She denies *** and any other symptoms.    Allergies:  has No Known Allergies.  Meds: No current outpatient medications on file.   No current facility-administered medications for this encounter.    Physical Findings: The patient is in no acute distress. Patient is alert and oriented.  vitals were not taken for this visit.  No significant changes. Lungs are clear to auscultation bilaterally. Heart has regular rate and rhythm. No palpable cervical, supraclavicular, or axillary adenopathy. Abdomen soft, non-tender, normal bowel sounds. Right Breast: no palpable mass, nipple discharge or bleeding. Left Breast: ***  Lab Findings: Lab Results  Component Value Date   WBC 5.0 06/04/2023   HGB 14.4 06/04/2023   HCT 43.8 06/04/2023   MCV 90.5 06/04/2023   PLT 302 06/04/2023    Radiographic Findings: MM Breast Surgical Specimen  Result Date: 07/02/2023 CLINICAL DATA:  Evaluate surgical specimen following lumpectomy for LEFT breast cancer. EXAM: SPECIMEN RADIOGRAPH OF THE LEFT BREAST COMPARISON:  Previous exam(s). FINDINGS: Status post excision of the LEFT breast. The radioactive seed and COIL biopsy clip are present and intact. IMPRESSION: Specimen radiograph of the LEFT breast. Electronically Signed   By: Harmon Pier M.D.   On: 07/02/2023 11:04   [place] LT RADIOACTIVE SEED LOC  Result Date: 07/01/2023 CLINICAL DATA:  57 year old female presents for radioactive seed localization of LEFT breast cancer prior to lumpectomy. EXAM: ULTRASOUND GUIDED RADIOACTIVE SEED LOCALIZATION OF THE LEFT BREAST DIAGNOSTIC LEFT MAMMOGRAM POST ULTRASOUND-GUIDED RADIOACTIVE SEED PLACEMENT  COMPARISON:  Previous exam(s). FINDINGS: Patient presents for radioactive seed localization prior to LEFT lumpectomy. I met with the patient and we discussed the  procedure of seed localization including benefits and alternatives. We discussed the high likelihood of a successful procedure. We discussed the risks of the procedure including infection, bleeding, tissue injury and further surgery. We discussed the low dose of radioactivity involved in the procedure. Informed, written consent was given. The usual time-out protocol was performed immediately prior to the procedure. Using ultrasound guidance, sterile technique, 1% lidocaine and an I-125 radioactive seed, the 1.7 cm mass at the 12 o'clock position of the LEFT breast 6 cm from the nipple was localized using a MEDIAL approach. The follow-up mammogram images confirm the seed in the expected location and were marked for Dr. Dwain Sarna. Follow-up survey of the patient confirms presence of the radioactive seed. Order number of I-125 seed:  [id number]. Total activity:  0.247 millicuries.  Reference Date: 05/15/2023 DIAGNOSTIC LEFT MAMMOGRAM POST ULTRASOUND-GUIDED RADIOACTIVE SEED PLACEMENT Mammographic images were obtained following ultrasound-guided radioactive seed placement. These demonstrate the radioactive seed in satisfactory position adjacent to the COIL shaped biopsy marking clip. The patient tolerated the procedure well and was released from the Breast Center. She was given instructions regarding seed removal. IMPRESSION: Radioactive seed localization LEFT breast. No apparent complications. Electronically Signed   By: Harmon Pier M.D.   On: 07/01/2023 10:58   MM CLIP PLACEMENT LEFT  Result Date: 07/01/2023 CLINICAL DATA:  57 year old female presents for radioactive seed localization of LEFT breast cancer prior to lumpectomy. EXAM: ULTRASOUND GUIDED RADIOACTIVE SEED LOCALIZATION OF THE LEFT BREAST DIAGNOSTIC LEFT MAMMOGRAM POST ULTRASOUND-GUIDED RADIOACTIVE SEED PLACEMENT COMPARISON:  Previous exam(s). FINDINGS: Patient presents for radioactive seed localization prior to LEFT lumpectomy. I met with the patient and  we discussed the procedure of seed localization including benefits and alternatives. We discussed the high likelihood of a successful procedure. We discussed the risks of the procedure including infection, bleeding, tissue injury and further surgery. We discussed the low dose of radioactivity involved in the procedure. Informed, written consent was given. The usual time-out protocol was performed immediately prior to the procedure. Using ultrasound guidance, sterile technique, 1% lidocaine and an I-125 radioactive seed, the 1.7 cm mass at the 12 o'clock position of the LEFT breast 6 cm from the nipple was localized using a MEDIAL approach. The follow-up mammogram images confirm the seed in the expected location and were marked for Dr. Dwain Sarna. Follow-up survey of the patient confirms presence of the radioactive seed. Order number of I-125 seed:  [id number]. Total activity:  0.247 millicuries.  Reference Date: 05/15/2023 DIAGNOSTIC LEFT MAMMOGRAM POST ULTRASOUND-GUIDED RADIOACTIVE SEED PLACEMENT Mammographic images were obtained following ultrasound-guided radioactive seed placement. These demonstrate the radioactive seed in satisfactory position adjacent to the COIL shaped biopsy marking clip. The patient tolerated the procedure well and was released from the Breast Center. She was given instructions regarding seed removal. IMPRESSION: Radioactive seed localization LEFT breast. No apparent complications. Electronically Signed   By: Harmon Pier M.D.   On: 07/01/2023 10:58    Impression: Stage IA (cT1c, cN0, cM0) Left Breast UOQ, Invasive ductal carcinoma with intermediate grade DCIS, ER+ / PR+ / Her2-, Grade 3 : s/p left breast lumpectomy and SLN biopsies with clear margins and negative nodes  ***  Plan:  Patient is scheduled for CT simulation {date/later today}. ***  -----------------------------------  Billie Lade, PhD, MD  This document serves as a record of services personally performed by Fayrene Fearing  Charday Capetillo, MD. It was created on his behalf by Neena Rhymes, a trained medical scribe. The creation of this record is based on the scribe's personal observations and the provider's statements to them. This document has been checked and approved by the attending provider.

## 2023-07-31 ENCOUNTER — Encounter: Payer: Self-pay | Admitting: Radiation Oncology

## 2023-07-31 ENCOUNTER — Ambulatory Visit
Admission: RE | Admit: 2023-07-31 | Discharge: 2023-07-31 | Disposition: A | Discharge status: Home / Self Care | Payer: BC Managed Care – PPO | Source: Ambulatory Visit | Attending: Radiation Oncology | Admitting: Radiation Oncology

## 2023-07-31 ENCOUNTER — Ambulatory Visit: Payer: BC Managed Care – PPO | Admitting: Radiation Oncology

## 2023-07-31 ENCOUNTER — Encounter: Payer: Self-pay | Admitting: *Deleted

## 2023-07-31 VITALS — BP 139/79 | HR 69 | Temp 97.8°F | Resp 18 | Ht 67.0 in | Wt 135.0 lb

## 2023-07-31 DIAGNOSIS — C50412 Malignant neoplasm of upper-outer quadrant of left female breast: Secondary | ICD-10-CM | POA: Diagnosis not present

## 2023-07-31 DIAGNOSIS — Z170 Estrogen receptor positive status [ER+]: Secondary | ICD-10-CM | POA: Diagnosis not present

## 2023-08-02 NOTE — Therapy (Unsigned)
OUTPATIENT PHYSICAL THERAPY BREAST CANCER TREATMENT   Patient Name: [name] MRN: [id number] DOB:[birth year], 57 y.o., female Today's Date: 08/04/2023  END OF SESSION:  PT End of Session - 08/04/23 1406     Visit Number 3    Number of Visits 3    PT Start Time 1406    PT Stop Time 1443    PT Time Calculation (min) 37 min    Activity Tolerance Patient tolerated treatment well    Behavior During Therapy [facility] for tasks assessed/performed              Past Medical History:  Diagnosis Date   Allergy    seasonal allergies   Benign colon polyp 10/25/2020   07/2020, Dr. Leone Payor; q 10 yr recall   Esophageal reflux    hx of-with certain foods   Frequent UTI    Heart murmur    current   Past Surgical History:  Procedure Laterality Date   BREAST BIOPSY Left 05/27/2023   [place] LT BREAST BX W LOC DEV 1ST LESION IMG BX SPEC US GUIDE 05/27/2023 [facility] MAMMOGRAPHY   BREAST BIOPSY Left 07/01/2023   [place] LT RADIOACTIVE SEED LOC 07/01/2023 [facility] ULTRASOUND   BREAST LUMPECTOMY WITH RADIOACTIVE SEED AND SENTINEL LYMPH NODE BIOPSY Left 07/02/2023   Procedure: LEFT BREAST SEED LOCALIZED LUMPECTOMY WITH SENTINEL NODE BIOPSY;  Surgeon: Emelia Loron, MD;  Location: [facility];  Service: General;  Laterality: Left;  LMA PEC BLOCK   CHOLECYSTECTOMY     2008   DILATION AND CURETTAGE OF UTERUS     WISDOM TOOTH EXTRACTION     Patient Active Problem List   Diagnosis Date Noted   Malignant neoplasm of upper-outer quadrant of left breast in female, estrogen receptor positive (HCC) 06/02/2023   Benign colon polyp 10/25/2020   Secondary insomnia 10/25/2020   Multinodular goiter 10/12/2020   GERD (gastroesophageal reflux disease) 07/28/2008    REFERRING PROVIDER: Dr. Emelia Loron (did surgery)/ Dr. Almond Lint (initially referred pt)  REFERRING DIAG: Left breast cancer  THERAPY DIAG:  Malignant neoplasm of upper-outer quadrant of left breast in female, estrogen  receptor positive (HCC)  Abnormal posture  Aftercare following surgery for neoplasm  Rationale for Evaluation and Treatment: Rehabilitation  ONSET DATE: 07/02/2023  SUBJECTIVE:                                                                                                                                                                                           SUBJECTIVE STATEMENT: Fortunately her Oncotype score was 20 so she is proceeding to radiation. They're holding off on radiation until my hematoma improves.  I see Dr. Roselind Messier next week. I feel like my shoulder ROM is much better - almost there.  PERTINENT HISTORY:  Patient was diagnosed on 05/28/2023 with left grade III invasive ductal carcinoma breast cancer. She underwent a left lumpectomy and sentinel node biopsy (1 negative node) on 07/02/2023. It is ER/PR positive and HER2 negative with a Ki67 of 40%.   PATIENT GOALS:  Reassess how my recovery is going related to arm function, pain, and swelling.  PAIN:  Are you having pain? No  PRECAUTIONS: Recent Surgery, left UE Lymphedema risk  RED FLAGS: None   ACTIVITY LEVEL / LEISURE:  She is walking almost every day for 20 minutes.  OBJECTIVE:     OBSERVATIONS:   POSTURE:  Forward head and rounded shoulders  LYMPHEDEMA ASSESSMENT:   UPPER EXTREMITY AROM/PROM:   A/PROM RIGHT   eval    Shoulder extension 52  Shoulder flexion 153  Shoulder abduction 157  Shoulder internal rotation 64  Shoulder external rotation 88                          (Blank rows = not tested)   A/PROM LEFT   eval LEFT 07/21/2023 LEFT 08/04/2023  Shoulder extension 50 47 50  Shoulder flexion 140 120 126  Shoulder abduction 163 115 153  Shoulder internal rotation 78 75 73  Shoulder external rotation 88 87 90                          (Blank rows = not tested)   CERVICAL AROM: All within normal limits   UPPER EXTREMITY STRENGTH: WNL   LYMPHEDEMA ASSESSMENTS (in cm):    LANDMARK RIGHT    eval RIGHT 07/21/2023  10 cm proximal to olecranon process 25.3 24.9  Olecranon process 22.7 22.3  10 cm proximal to ulnar styloid process 19.5 18.7  Just proximal to ulnar styloid process 14.6 14.3  Across hand at thumb web space 17.5 17.6  At base of 2nd digit 6 5.8  (Blank rows = not tested)   LANDMARK LEFT   eval LEFT 07/21/2023  10 cm proximal to olecranon process 24.7 24.4  Olecranon process 22.2 22.1  10 cm proximal to ulnar styloid process 19.1 18.3  Just proximal to ulnar styloid process 14.3 14.1  Across hand at thumb web space 18 16.9  At base of 2nd digit 6 5.8  (Blank rows = not tested) Surgery type/Date: Left lumpectomy and sentinel node biopsy 07/02/2023 Number of lymph nodes removed: 1 Current/past treatment (chemo, radiation, hormone therapy): none Other symptoms:  Heaviness/tightness No Pain No Pitting edema No Infections No Decreased scar mobility No Stemmer sign No  PATIENT EDUCATION:  Education details: HEP Person educated: Patient Education method: Programmer, multimedia, Facilities manager, and Handouts Education comprehension: verbalized understanding and returned demonstration  HOME EXERCISE PROGRAM: Reviewed previously given post op HEP. Added downward dog and supine bil shoulder flexion holding a weight for gravity to assist and achieve last 10-20 degrees of shoulder flexion.  TREATMENT 08/04/2023:  Performed left breast manual lymph drainage and soft tissue mobilization in supine to reduce fibrosis from hematoma. Utilized short neck, right axillary nodes, and left inguinal nodes; anterior inter-axillary and left axillo-inguinal pathway. Left breast focused primarily on the superior aspect redirecting along pathways. Slightly tender to palpation due to visible bruising but tolerated manual lymph drainage without complaint. Tissue felt slightly softer after treatment.  ASSESSMENT 08/04/2023:  CLINICAL IMPRESSION: Patient has  regained nearly full shoulder ROM  but is about 20 degrees limited compared to baseline with left shoulder abduction. She will benefit from the new HEP to regain end ROM and continue those through radiation. She reports feeling no functional limitation. She has no further need for PT except to return for SOZO screens.  Pt will benefit from skilled therapeutic intervention to improve on the following deficits: Decreased knowledge of precautions, impaired UE functional use, pain, decreased ROM, postural dysfunction.   PT treatment/interventions: ADL/Self care home management, [phone]- PT Re-evaluation, 97110-Therapeutic exercises, 97530- Therapeutic activity, 97535- Self Care, 28413- Manual therapy, Patient/Family education, Manual lymph drainage, and Scar mobilization   GOALS: Goals reviewed with patient? Yes  LONG TERM GOALS:  (STG=LTG)  GOALS Name Target Date  Goal status  1 Pt will demonstrate she has regained full shoulder ROM and function post operatively compared to baselines.  Baseline: 08/18/2023 PARTIALLY MET     PLAN:  PT FREQUENCY/DURATION: N/A PLAN FOR NEXT SESSION: Continue with SOZO screens  PHYSICAL THERAPY DISCHARGE SUMMARY  Visits from Start of Care: 3  Current functional level related to goals / functional outcomes: Goals met except slightly limited with left shoulder flexion AROM   Remaining deficits: Left shoulder flexion AROM   Education / Equipment: HEP and lymphedema education   Patient agrees to discharge. Patient goals were partially met. Patient is being discharged due to being pleased with the current functional level.  Bethann Punches, [place] 08/04/23 2:48 PM

## 2023-08-04 ENCOUNTER — Ambulatory Visit: Payer: BC Managed Care – PPO | Attending: General Surgery | Admitting: Physical Therapy

## 2023-08-04 ENCOUNTER — Encounter: Payer: Self-pay | Admitting: Physical Therapy

## 2023-08-04 DIAGNOSIS — Z483 Aftercare following surgery for neoplasm: Secondary | ICD-10-CM | POA: Insufficient documentation

## 2023-08-04 DIAGNOSIS — C50412 Malignant neoplasm of upper-outer quadrant of left female breast: Secondary | ICD-10-CM | POA: Diagnosis not present

## 2023-08-04 DIAGNOSIS — Z170 Estrogen receptor positive status [ER+]: Secondary | ICD-10-CM | POA: Diagnosis not present

## 2023-08-04 DIAGNOSIS — R293 Abnormal posture: Secondary | ICD-10-CM | POA: Insufficient documentation

## 2023-08-06 ENCOUNTER — Encounter: Payer: Self-pay | Admitting: *Deleted

## 2023-08-06 ENCOUNTER — Encounter (HOSPITAL_COMMUNITY): Payer: Self-pay

## 2023-08-06 DIAGNOSIS — Z170 Estrogen receptor positive status [ER+]: Secondary | ICD-10-CM

## 2023-08-14 ENCOUNTER — Other Ambulatory Visit: Payer: Self-pay

## 2023-08-14 ENCOUNTER — Ambulatory Visit
Admission: RE | Admit: 2023-08-14 | Discharge: 2023-08-14 | Disposition: A | Discharge status: Home / Self Care | Payer: BC Managed Care – PPO | Source: Ambulatory Visit | Attending: Radiation Oncology | Admitting: Radiation Oncology

## 2023-08-14 DIAGNOSIS — Z170 Estrogen receptor positive status [ER+]: Secondary | ICD-10-CM | POA: Insufficient documentation

## 2023-08-14 DIAGNOSIS — C50412 Malignant neoplasm of upper-outer quadrant of left female breast: Secondary | ICD-10-CM | POA: Insufficient documentation

## 2023-08-25 ENCOUNTER — Ambulatory Visit
Admission: RE | Admit: 2023-08-25 | Discharge: 2023-08-25 | Disposition: A | Discharge status: Home / Self Care | Payer: BC Managed Care – PPO | Source: Ambulatory Visit | Attending: Radiation Oncology | Admitting: Radiation Oncology

## 2023-08-25 DIAGNOSIS — Z170 Estrogen receptor positive status [ER+]: Secondary | ICD-10-CM | POA: Diagnosis not present

## 2023-08-25 DIAGNOSIS — Z510 Encounter for antineoplastic radiation therapy: Secondary | ICD-10-CM | POA: Diagnosis not present

## 2023-08-25 DIAGNOSIS — C50412 Malignant neoplasm of upper-outer quadrant of left female breast: Secondary | ICD-10-CM | POA: Diagnosis not present

## 2023-08-25 DIAGNOSIS — Z79811 Long term (current) use of aromatase inhibitors: Secondary | ICD-10-CM | POA: Diagnosis not present

## 2023-08-26 ENCOUNTER — Other Ambulatory Visit: Payer: Self-pay

## 2023-08-26 ENCOUNTER — Ambulatory Visit
Admission: RE | Admit: 2023-08-26 | Discharge: 2023-08-26 | Disposition: A | Discharge status: Home / Self Care | Payer: BC Managed Care – PPO | Source: Ambulatory Visit | Attending: Radiation Oncology | Admitting: Radiation Oncology

## 2023-08-26 DIAGNOSIS — Z170 Estrogen receptor positive status [ER+]: Secondary | ICD-10-CM

## 2023-08-26 DIAGNOSIS — Z79811 Long term (current) use of aromatase inhibitors: Secondary | ICD-10-CM | POA: Diagnosis not present

## 2023-08-26 DIAGNOSIS — C50412 Malignant neoplasm of upper-outer quadrant of left female breast: Secondary | ICD-10-CM | POA: Diagnosis not present

## 2023-08-26 DIAGNOSIS — Z510 Encounter for antineoplastic radiation therapy: Secondary | ICD-10-CM | POA: Diagnosis not present

## 2023-08-26 LAB — RAD ONC ARIA SESSION SUMMARY
Course Elapsed Days: 0
Plan Fractions Treated to Date: 1
Plan Prescribed Dose Per Fraction: 2.67 Gy
Plan Total Fractions Prescribed: 16
Plan Total Prescribed Dose: 42.72 Gy
Reference Point Dosage Given to Date: 2.67 Gy
Reference Point Session Dosage Given: 2.67 Gy
Session Number: 1

## 2023-08-26 MED ORDER — RADIAPLEXRX EX GEL: Freq: Once | CUTANEOUS | Status: DC

## 2023-08-26 MED ORDER — ALRA NON-METALLIC DEODORANT (RAD-ONC): 1.0000 | Freq: Once | TOPICAL | Status: DC

## 2023-08-26 NOTE — Addendum Note (Signed)
Encounter addended by: Rana Snare, LPN on: 16/09/958 8:51 AM  Actions taken: Order list changed, Diagnosis association updated

## 2023-08-27 ENCOUNTER — Other Ambulatory Visit: Payer: Self-pay

## 2023-08-27 ENCOUNTER — Ambulatory Visit
Admission: RE | Admit: 2023-08-27 | Discharge: 2023-08-27 | Disposition: A | Discharge status: Home / Self Care | Payer: BC Managed Care – PPO | Source: Ambulatory Visit | Attending: Radiation Oncology | Admitting: Radiation Oncology

## 2023-08-27 DIAGNOSIS — C50412 Malignant neoplasm of upper-outer quadrant of left female breast: Secondary | ICD-10-CM | POA: Diagnosis not present

## 2023-08-27 DIAGNOSIS — Z79811 Long term (current) use of aromatase inhibitors: Secondary | ICD-10-CM | POA: Diagnosis not present

## 2023-08-27 DIAGNOSIS — Z510 Encounter for antineoplastic radiation therapy: Secondary | ICD-10-CM | POA: Diagnosis not present

## 2023-08-27 DIAGNOSIS — Z170 Estrogen receptor positive status [ER+]: Secondary | ICD-10-CM | POA: Diagnosis not present

## 2023-08-27 LAB — RAD ONC ARIA SESSION SUMMARY
Course Elapsed Days: 1
Plan Fractions Treated to Date: 2
Plan Prescribed Dose Per Fraction: 2.67 Gy
Plan Total Fractions Prescribed: 16
Plan Total Prescribed Dose: 42.72 Gy
Reference Point Dosage Given to Date: 5.34 Gy
Reference Point Session Dosage Given: 2.67 Gy
Session Number: 2

## 2023-08-28 ENCOUNTER — Other Ambulatory Visit: Payer: Self-pay

## 2023-08-28 ENCOUNTER — Ambulatory Visit
Admission: RE | Admit: 2023-08-28 | Discharge: 2023-08-28 | Disposition: A | Discharge status: Home / Self Care | Payer: BC Managed Care – PPO | Source: Ambulatory Visit | Attending: Radiation Oncology | Admitting: Radiation Oncology

## 2023-08-28 DIAGNOSIS — C50412 Malignant neoplasm of upper-outer quadrant of left female breast: Secondary | ICD-10-CM | POA: Diagnosis not present

## 2023-08-28 DIAGNOSIS — Z510 Encounter for antineoplastic radiation therapy: Secondary | ICD-10-CM | POA: Diagnosis not present

## 2023-08-28 DIAGNOSIS — Z79811 Long term (current) use of aromatase inhibitors: Secondary | ICD-10-CM | POA: Diagnosis not present

## 2023-08-28 DIAGNOSIS — Z170 Estrogen receptor positive status [ER+]: Secondary | ICD-10-CM | POA: Diagnosis not present

## 2023-08-28 LAB — RAD ONC ARIA SESSION SUMMARY
Course Elapsed Days: 2
Plan Fractions Treated to Date: 3
Plan Prescribed Dose Per Fraction: 2.67 Gy
Plan Total Fractions Prescribed: 16
Plan Total Prescribed Dose: 42.72 Gy
Reference Point Dosage Given to Date: 8.01 Gy
Reference Point Session Dosage Given: 2.67 Gy
Session Number: 3

## 2023-08-29 ENCOUNTER — Ambulatory Visit
Admission: RE | Admit: 2023-08-29 | Discharge: 2023-08-29 | Disposition: A | Discharge status: Home / Self Care | Payer: BC Managed Care – PPO | Source: Ambulatory Visit | Attending: Radiation Oncology | Admitting: Radiation Oncology

## 2023-08-29 ENCOUNTER — Other Ambulatory Visit: Payer: Self-pay

## 2023-08-29 DIAGNOSIS — Z170 Estrogen receptor positive status [ER+]: Secondary | ICD-10-CM | POA: Diagnosis not present

## 2023-08-29 DIAGNOSIS — C50412 Malignant neoplasm of upper-outer quadrant of left female breast: Secondary | ICD-10-CM | POA: Diagnosis not present

## 2023-08-29 DIAGNOSIS — Z510 Encounter for antineoplastic radiation therapy: Secondary | ICD-10-CM | POA: Diagnosis not present

## 2023-08-29 DIAGNOSIS — Z79811 Long term (current) use of aromatase inhibitors: Secondary | ICD-10-CM | POA: Diagnosis not present

## 2023-08-29 LAB — RAD ONC ARIA SESSION SUMMARY
Course Elapsed Days: 3
Plan Fractions Treated to Date: 4
Plan Prescribed Dose Per Fraction: 2.67 Gy
Plan Total Fractions Prescribed: 16
Plan Total Prescribed Dose: 42.72 Gy
Reference Point Dosage Given to Date: 10.68 Gy
Reference Point Session Dosage Given: 2.67 Gy
Session Number: 4

## 2023-09-01 ENCOUNTER — Ambulatory Visit
Admission: RE | Admit: 2023-09-01 | Discharge: 2023-09-01 | Disposition: A | Discharge status: Home / Self Care | Payer: BC Managed Care – PPO | Source: Ambulatory Visit | Attending: Radiation Oncology | Admitting: Radiation Oncology

## 2023-09-01 ENCOUNTER — Other Ambulatory Visit: Payer: Self-pay

## 2023-09-01 DIAGNOSIS — Z510 Encounter for antineoplastic radiation therapy: Secondary | ICD-10-CM | POA: Diagnosis not present

## 2023-09-01 DIAGNOSIS — Z170 Estrogen receptor positive status [ER+]: Secondary | ICD-10-CM | POA: Diagnosis not present

## 2023-09-01 DIAGNOSIS — C50412 Malignant neoplasm of upper-outer quadrant of left female breast: Secondary | ICD-10-CM | POA: Diagnosis not present

## 2023-09-01 DIAGNOSIS — Z79811 Long term (current) use of aromatase inhibitors: Secondary | ICD-10-CM | POA: Diagnosis not present

## 2023-09-01 LAB — RAD ONC ARIA SESSION SUMMARY
Course Elapsed Days: 6
Plan Fractions Treated to Date: 5
Plan Prescribed Dose Per Fraction: 2.67 Gy
Plan Total Fractions Prescribed: 16
Plan Total Prescribed Dose: 42.72 Gy
Reference Point Dosage Given to Date: 13.35 Gy
Reference Point Session Dosage Given: 2.67 Gy
Session Number: 5

## 2023-09-02 ENCOUNTER — Other Ambulatory Visit: Payer: Self-pay

## 2023-09-02 ENCOUNTER — Ambulatory Visit
Admission: RE | Admit: 2023-09-02 | Discharge: 2023-09-02 | Disposition: A | Discharge status: Home / Self Care | Payer: BC Managed Care – PPO | Source: Ambulatory Visit | Attending: Radiation Oncology | Admitting: Radiation Oncology

## 2023-09-02 ENCOUNTER — Ambulatory Visit
Admission: RE | Admit: 2023-09-02 | Discharge: 2023-09-02 | Discharge status: Home / Self Care | Payer: BC Managed Care – PPO | Source: Ambulatory Visit | Attending: Radiation Oncology | Admitting: Radiation Oncology

## 2023-09-02 DIAGNOSIS — C50412 Malignant neoplasm of upper-outer quadrant of left female breast: Secondary | ICD-10-CM | POA: Diagnosis not present

## 2023-09-02 DIAGNOSIS — Z510 Encounter for antineoplastic radiation therapy: Secondary | ICD-10-CM | POA: Diagnosis not present

## 2023-09-02 DIAGNOSIS — Z79811 Long term (current) use of aromatase inhibitors: Secondary | ICD-10-CM | POA: Diagnosis not present

## 2023-09-02 DIAGNOSIS — Z170 Estrogen receptor positive status [ER+]: Secondary | ICD-10-CM | POA: Diagnosis not present

## 2023-09-02 LAB — RAD ONC ARIA SESSION SUMMARY
Course Elapsed Days: 7
Plan Fractions Treated to Date: 6
Plan Prescribed Dose Per Fraction: 2.67 Gy
Plan Total Fractions Prescribed: 16
Plan Total Prescribed Dose: 42.72 Gy
Reference Point Dosage Given to Date: 16.02 Gy
Reference Point Session Dosage Given: 2.67 Gy
Session Number: 6

## 2023-09-03 ENCOUNTER — Other Ambulatory Visit: Payer: Self-pay

## 2023-09-03 ENCOUNTER — Ambulatory Visit
Admission: RE | Admit: 2023-09-03 | Discharge: 2023-09-03 | Disposition: A | Discharge status: Home / Self Care | Payer: BC Managed Care – PPO | Source: Ambulatory Visit | Attending: Radiation Oncology | Admitting: Radiation Oncology

## 2023-09-03 DIAGNOSIS — Z79811 Long term (current) use of aromatase inhibitors: Secondary | ICD-10-CM | POA: Diagnosis not present

## 2023-09-03 DIAGNOSIS — Z170 Estrogen receptor positive status [ER+]: Secondary | ICD-10-CM | POA: Diagnosis not present

## 2023-09-03 DIAGNOSIS — C50412 Malignant neoplasm of upper-outer quadrant of left female breast: Secondary | ICD-10-CM | POA: Diagnosis not present

## 2023-09-03 DIAGNOSIS — Z510 Encounter for antineoplastic radiation therapy: Secondary | ICD-10-CM | POA: Diagnosis not present

## 2023-09-03 LAB — RAD ONC ARIA SESSION SUMMARY
Course Elapsed Days: 8
Plan Fractions Treated to Date: 7
Plan Prescribed Dose Per Fraction: 2.67 Gy
Plan Total Fractions Prescribed: 16
Plan Total Prescribed Dose: 42.72 Gy
Reference Point Dosage Given to Date: 18.69 Gy
Reference Point Session Dosage Given: 2.67 Gy
Session Number: 7

## 2023-09-04 ENCOUNTER — Other Ambulatory Visit: Payer: Self-pay

## 2023-09-04 ENCOUNTER — Ambulatory Visit
Admission: RE | Admit: 2023-09-04 | Discharge: 2023-09-04 | Disposition: A | Discharge status: Home / Self Care | Payer: BC Managed Care – PPO | Source: Ambulatory Visit | Attending: Radiation Oncology | Admitting: Radiation Oncology

## 2023-09-04 DIAGNOSIS — Z510 Encounter for antineoplastic radiation therapy: Secondary | ICD-10-CM | POA: Diagnosis not present

## 2023-09-04 DIAGNOSIS — Z170 Estrogen receptor positive status [ER+]: Secondary | ICD-10-CM | POA: Diagnosis not present

## 2023-09-04 DIAGNOSIS — Z79811 Long term (current) use of aromatase inhibitors: Secondary | ICD-10-CM | POA: Diagnosis not present

## 2023-09-04 DIAGNOSIS — C50412 Malignant neoplasm of upper-outer quadrant of left female breast: Secondary | ICD-10-CM | POA: Diagnosis not present

## 2023-09-04 LAB — RAD ONC ARIA SESSION SUMMARY
Course Elapsed Days: 9
Plan Fractions Treated to Date: 8
Plan Prescribed Dose Per Fraction: 2.67 Gy
Plan Total Fractions Prescribed: 16
Plan Total Prescribed Dose: 42.72 Gy
Reference Point Dosage Given to Date: 21.36 Gy
Reference Point Session Dosage Given: 2.67 Gy
Session Number: 8

## 2023-09-05 ENCOUNTER — Ambulatory Visit
Admission: RE | Admit: 2023-09-05 | Discharge: 2023-09-05 | Disposition: A | Discharge status: Home / Self Care | Payer: BC Managed Care – PPO | Source: Ambulatory Visit | Attending: Radiation Oncology | Admitting: Radiation Oncology

## 2023-09-05 ENCOUNTER — Other Ambulatory Visit: Payer: Self-pay

## 2023-09-05 DIAGNOSIS — C50412 Malignant neoplasm of upper-outer quadrant of left female breast: Secondary | ICD-10-CM | POA: Diagnosis not present

## 2023-09-05 DIAGNOSIS — Z510 Encounter for antineoplastic radiation therapy: Secondary | ICD-10-CM | POA: Diagnosis not present

## 2023-09-05 DIAGNOSIS — Z170 Estrogen receptor positive status [ER+]: Secondary | ICD-10-CM | POA: Diagnosis not present

## 2023-09-05 DIAGNOSIS — Z79811 Long term (current) use of aromatase inhibitors: Secondary | ICD-10-CM | POA: Diagnosis not present

## 2023-09-05 LAB — RAD ONC ARIA SESSION SUMMARY
Course Elapsed Days: 10
Plan Fractions Treated to Date: 9
Plan Prescribed Dose Per Fraction: 2.67 Gy
Plan Total Fractions Prescribed: 16
Plan Total Prescribed Dose: 42.72 Gy
Reference Point Dosage Given to Date: 24.03 Gy
Reference Point Session Dosage Given: 2.67 Gy
Session Number: 9

## 2023-09-08 ENCOUNTER — Other Ambulatory Visit: Payer: Self-pay

## 2023-09-08 ENCOUNTER — Ambulatory Visit
Admission: RE | Admit: 2023-09-08 | Discharge: 2023-09-08 | Disposition: A | Discharge status: Home / Self Care | Payer: BC Managed Care – PPO | Source: Ambulatory Visit | Attending: Radiation Oncology | Admitting: Radiation Oncology

## 2023-09-08 DIAGNOSIS — C50412 Malignant neoplasm of upper-outer quadrant of left female breast: Secondary | ICD-10-CM | POA: Diagnosis not present

## 2023-09-08 DIAGNOSIS — Z510 Encounter for antineoplastic radiation therapy: Secondary | ICD-10-CM | POA: Diagnosis not present

## 2023-09-08 DIAGNOSIS — Z170 Estrogen receptor positive status [ER+]: Secondary | ICD-10-CM | POA: Diagnosis not present

## 2023-09-08 DIAGNOSIS — Z79811 Long term (current) use of aromatase inhibitors: Secondary | ICD-10-CM | POA: Diagnosis not present

## 2023-09-08 LAB — RAD ONC ARIA SESSION SUMMARY
Course Elapsed Days: 13
Plan Fractions Treated to Date: 10
Plan Prescribed Dose Per Fraction: 2.67 Gy
Plan Total Fractions Prescribed: 16
Plan Total Prescribed Dose: 42.72 Gy
Reference Point Dosage Given to Date: 26.7 Gy
Reference Point Session Dosage Given: 2.67 Gy
Session Number: 10

## 2023-09-09 ENCOUNTER — Ambulatory Visit
Admission: RE | Admit: 2023-09-09 | Discharge: 2023-09-09 | Disposition: A | Discharge status: Home / Self Care | Payer: BC Managed Care – PPO | Source: Ambulatory Visit | Attending: Radiation Oncology | Admitting: Radiation Oncology

## 2023-09-09 ENCOUNTER — Telehealth: Payer: Self-pay | Admitting: *Deleted

## 2023-09-09 ENCOUNTER — Other Ambulatory Visit: Payer: Self-pay

## 2023-09-09 DIAGNOSIS — C50412 Malignant neoplasm of upper-outer quadrant of left female breast: Secondary | ICD-10-CM

## 2023-09-09 DIAGNOSIS — Z79811 Long term (current) use of aromatase inhibitors: Secondary | ICD-10-CM | POA: Diagnosis not present

## 2023-09-09 DIAGNOSIS — Z170 Estrogen receptor positive status [ER+]: Secondary | ICD-10-CM | POA: Diagnosis not present

## 2023-09-09 DIAGNOSIS — Z510 Encounter for antineoplastic radiation therapy: Secondary | ICD-10-CM | POA: Diagnosis not present

## 2023-09-09 LAB — RAD ONC ARIA SESSION SUMMARY
Course Elapsed Days: 14
Plan Fractions Treated to Date: 11
Plan Prescribed Dose Per Fraction: 2.67 Gy
Plan Total Fractions Prescribed: 16
Plan Total Prescribed Dose: 42.72 Gy
Reference Point Dosage Given to Date: 29.37 Gy
Reference Point Session Dosage Given: 2.67 Gy
Session Number: 11

## 2023-09-09 MED ORDER — RADIAPLEXRX EX GEL: Freq: Once | CUTANEOUS | Status: AC

## 2023-09-09 NOTE — Telephone Encounter (Signed)
CALLED PATIENT TO INFORM OF FU APPT. WITH DR. KINARD ON 10-20-23 @ 10 AM, SPOKE WITH PATIENT AND SHE IS AWARE OF THIS APPT.

## 2023-09-10 ENCOUNTER — Other Ambulatory Visit: Payer: Self-pay

## 2023-09-10 ENCOUNTER — Ambulatory Visit
Admission: RE | Admit: 2023-09-10 | Discharge: 2023-09-10 | Discharge status: Home / Self Care | Payer: BC Managed Care – PPO | Source: Ambulatory Visit | Attending: Radiation Oncology | Admitting: Radiation Oncology

## 2023-09-10 DIAGNOSIS — Z170 Estrogen receptor positive status [ER+]: Secondary | ICD-10-CM | POA: Diagnosis not present

## 2023-09-10 DIAGNOSIS — C50412 Malignant neoplasm of upper-outer quadrant of left female breast: Secondary | ICD-10-CM

## 2023-09-10 DIAGNOSIS — Z79811 Long term (current) use of aromatase inhibitors: Secondary | ICD-10-CM | POA: Diagnosis not present

## 2023-09-10 DIAGNOSIS — Z510 Encounter for antineoplastic radiation therapy: Secondary | ICD-10-CM | POA: Diagnosis not present

## 2023-09-10 LAB — RAD ONC ARIA SESSION SUMMARY
Course Elapsed Days: 15
Plan Fractions Treated to Date: 12
Plan Prescribed Dose Per Fraction: 2.67 Gy
Plan Total Fractions Prescribed: 16
Plan Total Prescribed Dose: 42.72 Gy
Reference Point Dosage Given to Date: 32.04 Gy
Reference Point Session Dosage Given: 2.67 Gy
Session Number: 12

## 2023-09-11 ENCOUNTER — Ambulatory Visit
Admission: RE | Admit: 2023-09-11 | Discharge: 2023-09-11 | Disposition: A | Discharge status: Home / Self Care | Payer: BC Managed Care – PPO | Source: Ambulatory Visit | Attending: Radiation Oncology | Admitting: Radiation Oncology

## 2023-09-11 ENCOUNTER — Other Ambulatory Visit: Payer: Self-pay

## 2023-09-11 DIAGNOSIS — Z510 Encounter for antineoplastic radiation therapy: Secondary | ICD-10-CM | POA: Diagnosis not present

## 2023-09-11 DIAGNOSIS — C50412 Malignant neoplasm of upper-outer quadrant of left female breast: Secondary | ICD-10-CM | POA: Diagnosis not present

## 2023-09-11 DIAGNOSIS — Z170 Estrogen receptor positive status [ER+]: Secondary | ICD-10-CM | POA: Diagnosis not present

## 2023-09-11 DIAGNOSIS — Z79811 Long term (current) use of aromatase inhibitors: Secondary | ICD-10-CM | POA: Diagnosis not present

## 2023-09-11 LAB — RAD ONC ARIA SESSION SUMMARY
Course Elapsed Days: 16
Plan Fractions Treated to Date: 13
Plan Prescribed Dose Per Fraction: 2.67 Gy
Plan Total Fractions Prescribed: 16
Plan Total Prescribed Dose: 42.72 Gy
Reference Point Dosage Given to Date: 34.71 Gy
Reference Point Session Dosage Given: 2.67 Gy
Session Number: 13

## 2023-09-12 ENCOUNTER — Other Ambulatory Visit: Payer: Self-pay

## 2023-09-12 ENCOUNTER — Ambulatory Visit
Admission: RE | Admit: 2023-09-12 | Discharge: 2023-09-12 | Disposition: A | Discharge status: Home / Self Care | Payer: BC Managed Care – PPO | Source: Ambulatory Visit | Attending: Radiation Oncology | Admitting: Radiation Oncology

## 2023-09-12 DIAGNOSIS — Z79811 Long term (current) use of aromatase inhibitors: Secondary | ICD-10-CM | POA: Diagnosis not present

## 2023-09-12 DIAGNOSIS — C50412 Malignant neoplasm of upper-outer quadrant of left female breast: Secondary | ICD-10-CM | POA: Diagnosis not present

## 2023-09-12 DIAGNOSIS — Z170 Estrogen receptor positive status [ER+]: Secondary | ICD-10-CM | POA: Diagnosis not present

## 2023-09-12 DIAGNOSIS — Z510 Encounter for antineoplastic radiation therapy: Secondary | ICD-10-CM | POA: Diagnosis not present

## 2023-09-12 LAB — RAD ONC ARIA SESSION SUMMARY
Course Elapsed Days: 17
Plan Fractions Treated to Date: 14
Plan Prescribed Dose Per Fraction: 2.67 Gy
Plan Total Fractions Prescribed: 16
Plan Total Prescribed Dose: 42.72 Gy
Reference Point Dosage Given to Date: 37.38 Gy
Reference Point Session Dosage Given: 2.67 Gy
Session Number: 14

## 2023-09-15 ENCOUNTER — Inpatient Hospital Stay (HOSPITAL_BASED_OUTPATIENT_CLINIC_OR_DEPARTMENT_OTHER): Payer: BC Managed Care – PPO | Admitting: Hematology and Oncology

## 2023-09-15 ENCOUNTER — Ambulatory Visit
Admission: RE | Admit: 2023-09-15 | Discharge: 2023-09-15 | Disposition: A | Discharge status: Home / Self Care | Payer: BC Managed Care – PPO | Source: Ambulatory Visit | Attending: Radiation Oncology | Admitting: Radiation Oncology

## 2023-09-15 ENCOUNTER — Other Ambulatory Visit: Payer: Self-pay

## 2023-09-15 VITALS — BP 142/80 | HR 75 | Temp 97.2°F | Resp 18 | Ht 67.0 in | Wt 137.3 lb

## 2023-09-15 DIAGNOSIS — C50412 Malignant neoplasm of upper-outer quadrant of left female breast: Secondary | ICD-10-CM | POA: Insufficient documentation

## 2023-09-15 DIAGNOSIS — Z923 Personal history of irradiation: Secondary | ICD-10-CM | POA: Insufficient documentation

## 2023-09-15 DIAGNOSIS — Z170 Estrogen receptor positive status [ER+]: Secondary | ICD-10-CM | POA: Insufficient documentation

## 2023-09-15 DIAGNOSIS — Z1732 Human epidermal growth factor receptor 2 negative status: Secondary | ICD-10-CM | POA: Insufficient documentation

## 2023-09-15 DIAGNOSIS — L598 Other specified disorders of the skin and subcutaneous tissue related to radiation: Secondary | ICD-10-CM | POA: Insufficient documentation

## 2023-09-15 DIAGNOSIS — Z510 Encounter for antineoplastic radiation therapy: Secondary | ICD-10-CM | POA: Diagnosis not present

## 2023-09-15 DIAGNOSIS — Z780 Asymptomatic menopausal state: Secondary | ICD-10-CM

## 2023-09-15 DIAGNOSIS — Z79811 Long term (current) use of aromatase inhibitors: Secondary | ICD-10-CM | POA: Insufficient documentation

## 2023-09-15 DIAGNOSIS — Z1721 Progesterone receptor positive status: Secondary | ICD-10-CM | POA: Insufficient documentation

## 2023-09-15 LAB — RAD ONC ARIA SESSION SUMMARY
Course Elapsed Days: 20
Plan Fractions Treated to Date: 15
Plan Prescribed Dose Per Fraction: 2.67 Gy
Plan Total Fractions Prescribed: 16
Plan Total Prescribed Dose: 42.72 Gy
Reference Point Dosage Given to Date: 40.05 Gy
Reference Point Session Dosage Given: 2.67 Gy
Session Number: 15

## 2023-09-15 MED ORDER — ANASTROZOLE 1 MG PO TABS: 1.0000 mg | ORAL_TABLET | Freq: Every day | ORAL | 3 refills | Status: DC

## 2023-09-15 NOTE — Assessment & Plan Note (Signed)
05/27/2023:Screening mammogram detected left breast asymmetry 12 o'clock position measuring 1.7 cm by ultrasound.  Ultrasound-guided biopsy revealed grade 3 IDC no LVI, ER 100%, PR 30%, Ki67 40%, HER2 negative    07/02/2023: Left lumpectomy: Grade 3 IDC 1.4 cm with intermediate grade DCIS, margins negative, LVI not identified, ER 100%, PR 30%, HER2 -0, Ki-67 40% 0/1 lymph node negative Oncotype DX score: 20 (distant recurrence at 9 years: 6%)   Treatment plan: Adjuvant radiation therapy 08/27/2023-09/16/2023 Adjuvant antiestrogen therapy   Her son is an avid golfer.  Her husband also enjoys golf. She works as a Interior and spatial designer.  Anastrozole counseling: We discussed the risks and benefits of anti-estrogen therapy with aromatase inhibitors. These include but not limited to insomnia, hot flashes, mood changes, vaginal dryness, bone density loss, and weight gain. We strongly believe that the benefits far outweigh the risks. Patient understands these risks and consented to starting treatment. Planned treatment duration is 7 years.  Return to clinic in 3 months for survivorship care plan visit

## 2023-09-15 NOTE — Progress Notes (Signed)
Patient Care Team: [name], MD as PCP - General (Family Medicine) Tracey Harries, MD as Consulting Physician (Obstetrics and Gynecology) Iva Boop, MD as Consulting Physician (Gastroenterology) Holli Humbles, MD as Consulting Physician (Ophthalmology) Aquilla Hacker, PA-C as Physician Assistant (Otolaryngology) Pershing Proud, RN as Oncology Nurse Navigator Donnelly Angelica, RN as Oncology Nurse Navigator Almond Lint, MD as Consulting Physician (General Surgery) [name], MD as Consulting Physician (Hematology and Oncology) Antony Blackbird, MD as Consulting Physician (Radiation Oncology)  DIAGNOSIS:  Encounter Diagnoses  Name Primary?   Malignant neoplasm of upper-outer quadrant of left breast in female, estrogen receptor positive (HCC) Yes   Post-menopausal     SUMMARY OF ONCOLOGIC HISTORY: Oncology History  Malignant neoplasm of upper-outer quadrant of left breast in female, estrogen receptor positive (HCC)  05/27/2023 Initial Diagnosis   Screening mammogram detected left breast asymmetry 12 o'clock position measuring 1.7 cm by ultrasound.  Ultrasound-guided biopsy revealed grade 3 IDC no LVI, ER 100%, PR 30%, Ki67 40%, HER2 negative   06/04/2023 Cancer Staging   Staging form: Breast, AJCC 8th Edition - Clinical: Stage IA (cT1c, cN0, cM0, G3, ER+, PR+, HER2-) - Signed by [name], MD on 06/04/2023 Stage prefix: Initial diagnosis Histologic grading system: 3 grade system   07/02/2023 Surgery   Left lumpectomy: Grade 3 IDC 1.4 cm with intermediate grade DCIS, margins negative, LVI not identified, ER 100%, PR 30%, HER2 -0, Ki-67 40% 0/1 lymph node negative   08/24/2023 Oncotype testing   Oncotype DX score: 20 (distant recurrence at 9 years: 6%)   08/27/2023 - 09/16/2023 Radiation Therapy   Adjuvant radiation     CHIEF COMPLIANT: Follow-up after radiation is completed  HISTORY OF PRESENT ILLNESS: Ms. [name] is a 57 year old with  above-mentioned history of breast cancer who was low risk on Oncotype and tomorrow she finishes her radiation therapy.  She has done fairly well from radiation standpoint with some radiation dermatitis.  She is here today accompanied by her husband to discuss antiestrogen therapy plan.        ALLERGIES:  has no known allergies.  MEDICATIONS:  Current Outpatient Medications  Medication Sig Dispense Refill   anastrozole (ARIMIDEX) 1 MG tablet Take 1 tablet (1 mg total) by mouth daily. 90 tablet 3   No current facility-administered medications for this visit.    PHYSICAL EXAMINATION: ECOG PERFORMANCE STATUS: 1 - Symptomatic but completely ambulatory  Vitals:   09/15/23 1003  BP: (!) 142/80  Pulse: 75  Resp: 18  Temp: (!) 97.2 F (36.2 C)  SpO2: 100%   Filed Weights   09/15/23 1003  Weight: 137 lb 4.8 oz (62.3 kg)    Physical Exam          (exam performed in the presence of a chaperone)  LABORATORY DATA:  I have reviewed the data as listed    Latest Ref Rng & Units 06/04/2023   12:28 PM 07/04/2021   11:20 AM 05/18/2020    9:13 AM  CMP  Glucose 70 - 99 mg/dL 93  70  80   BUN 6 - 20 mg/dL 9  9  10    Creatinine 0.44 - 1.00 mg/dL 1.91  4.78  2.95   Sodium 135 - 145 mmol/L 142  140  141   Potassium 3.5 - 5.1 mmol/L 4.0  4.0  4.8   Chloride 98 - 111 mmol/L 106  103  102   CO2 22 - 32 mmol/L 31  31  28  Calcium 8.9 - 10.3 mg/dL 9.9  9.8  9.5   Total Protein 6.5 - 8.1 g/dL 7.7  7.5  7.1   Total Bilirubin 0.3 - 1.2 mg/dL 0.5  0.5  0.4   Alkaline Phos 38 - 126 U/L 89  88  97   AST 15 - 41 U/L 16  17  20    ALT 0 - 44 U/L 18  17  21      Lab Results  Component Value Date   WBC 5.0 06/04/2023   HGB 14.4 06/04/2023   HCT 43.8 06/04/2023   MCV 90.5 06/04/2023   PLT 302 06/04/2023   NEUTROABS 2.7 06/04/2023    ASSESSMENT & PLAN:  Malignant neoplasm of upper-outer quadrant of left breast in female, estrogen receptor positive (HCC) 05/27/2023:Screening mammogram detected  left breast asymmetry 12 o'clock position measuring 1.7 cm by ultrasound.  Ultrasound-guided biopsy revealed grade 3 IDC no LVI, ER 100%, PR 30%, Ki67 40%, HER2 negative    07/02/2023: Left lumpectomy: Grade 3 IDC 1.4 cm with intermediate grade DCIS, margins negative, LVI not identified, ER 100%, PR 30%, HER2 -0, Ki-67 40% 0/1 lymph node negative Oncotype DX score: 20 (distant recurrence at 9 years: 6%)   Treatment plan: Adjuvant radiation therapy 08/27/2023-09/16/2023 Adjuvant antiestrogen therapy   Her son is an avid golfer.  Her husband also enjoys golf. She works as a Interior and spatial designer.  Anastrozole counseling: We discussed the risks and benefits of anti-estrogen therapy with aromatase inhibitors. These include but not limited to insomnia, hot flashes, mood changes, vaginal dryness, bone density loss, and weight gain. We strongly believe that the benefits far outweigh the risks. Patient understands these risks and consented to starting treatment. Planned treatment duration is 7 years.  Return to clinic in 3 months for survivorship care plan visit     Orders Placed This Encounter  Procedures   DG Bone Density    Standing Status:   Future    Expected Date:   10/16/2023    Expiration Date:   09/14/2024    Reason for Exam (SYMPTOM  OR DIAGNOSIS REQUIRED):   post menopausal    Is patient pregnant?:   No    Preferred imaging location?:   [facility]    Release to patient:   Immediate   The patient has a good understanding of the overall plan. she agrees with it. she will call with any problems that may develop before the next visit here. Total time spent: 30 mins including face to face time and time spent for planning, charting and co-ordination of care   Tamsen Meek, MD 09/15/23

## 2023-09-16 ENCOUNTER — Other Ambulatory Visit: Payer: Self-pay

## 2023-09-16 ENCOUNTER — Ambulatory Visit
Admission: RE | Admit: 2023-09-16 | Discharge: 2023-09-16 | Disposition: A | Discharge status: Home / Self Care | Payer: BC Managed Care – PPO | Source: Ambulatory Visit | Attending: Radiation Oncology | Admitting: Radiation Oncology

## 2023-09-16 DIAGNOSIS — C50412 Malignant neoplasm of upper-outer quadrant of left female breast: Secondary | ICD-10-CM | POA: Diagnosis not present

## 2023-09-16 DIAGNOSIS — Z79811 Long term (current) use of aromatase inhibitors: Secondary | ICD-10-CM | POA: Diagnosis not present

## 2023-09-16 DIAGNOSIS — Z170 Estrogen receptor positive status [ER+]: Secondary | ICD-10-CM | POA: Diagnosis not present

## 2023-09-16 DIAGNOSIS — Z510 Encounter for antineoplastic radiation therapy: Secondary | ICD-10-CM | POA: Diagnosis not present

## 2023-09-16 LAB — RAD ONC ARIA SESSION SUMMARY
Course Elapsed Days: 21
Plan Fractions Treated to Date: 16
Plan Prescribed Dose Per Fraction: 2.67 Gy
Plan Total Fractions Prescribed: 16
Plan Total Prescribed Dose: 42.72 Gy
Reference Point Dosage Given to Date: 42.72 Gy
Reference Point Session Dosage Given: 2.67 Gy
Session Number: 16

## 2023-09-18 ENCOUNTER — Telehealth (HOSPITAL_BASED_OUTPATIENT_CLINIC_OR_DEPARTMENT_OTHER): Payer: Self-pay | Admitting: Hematology and Oncology

## 2023-09-18 NOTE — Radiation Completion Notes (Signed)
Patient Name: [name], [name] MRN: [id number] Date of Birth: 06-28-1966 Referring Physician: [name], M.D. Date of Service: 2023-09-18 Radiation Oncologist: Arnette Schaumann, M.D. [facility]                             RADIATION ONCOLOGY END OF TREATMENT NOTE     Diagnosis: C50.412 Malignant neoplasm of upper-outer quadrant of left female breast Staging on 2023-06-04: Malignant neoplasm of upper-outer quadrant of left breast in female, estrogen receptor positive (HCC) T=cT1c, N=cN0, M=cM0 Intent: Curative     ==========DELIVERED PLANS==========  First Treatment Date: 2023-08-26 Last Treatment Date: 2023-09-16   Plan Name: Breast_L_BH Site: Breast, Left Technique: 3D Mode: Photon Dose Per Fraction: 2.67 Gy Prescribed Dose (Delivered / Prescribed): 42.72 Gy / 42.72 Gy Prescribed Fxs (Delivered / Prescribed): 16 / 16     ==========ON TREATMENT VISIT DATES========== 2023-08-26, 2023-09-02, 2023-09-09     ==========UPCOMING VISITS========== 12/15/2023 [facility] ONCOLOGY SURVIVORSHIP CARE PLAN VISIT Loa Socks, NP  10/20/2023 [facility] ONC FOLLOW UP 15 Antony Blackbird, MD  10/14/2023 [facility] DIAG RAD DG DEXA [facility]DEXA  09/29/2023 [facility] RE[facility] AT [facility] SCREEN Berna Spare A, PTA        ==========APPENDIX - ON TREATMENT VISIT NOTES==========   See weekly On Treatment Notes in Epic for details in the Media tab (listed as Progress notes on the On Treatment Visit Dates listed above).

## 2023-09-26 ENCOUNTER — Telehealth: Payer: Self-pay

## 2023-09-26 ENCOUNTER — Other Ambulatory Visit: Payer: Self-pay | Admitting: Radiology

## 2023-09-26 DIAGNOSIS — C50412 Malignant neoplasm of upper-outer quadrant of left female breast: Secondary | ICD-10-CM

## 2023-09-26 MED ORDER — TRIAMCINOLONE ACETONIDE 0.5 % EX OINT: 1.0000 | TOPICAL_OINTMENT | Freq: Two times a day (BID) | CUTANEOUS | 0 refills | Status: DC

## 2023-09-26 NOTE — Telephone Encounter (Signed)
 Patient called in to report worsening skin itching  and redness to left breast. Patient completed radiation treatment to left breast on 09/16/23. Patient reports applying hydrocortisone with no relief. Patient sent in photo of skin rash. PA-C is aware.

## 2023-09-26 NOTE — Telephone Encounter (Signed)
 Ellie any suggestions for this patients skin. She has applied hydrocortisone cream without relief.

## 2023-09-29 ENCOUNTER — Ambulatory Visit: Payer: BC Managed Care – PPO

## 2023-10-06 ENCOUNTER — Ambulatory Visit: Payer: BC Managed Care – PPO | Attending: General Surgery

## 2023-10-06 VITALS — Wt 134.4 lb

## 2023-10-06 DIAGNOSIS — Z483 Aftercare following surgery for neoplasm: Secondary | ICD-10-CM | POA: Insufficient documentation

## 2023-10-06 NOTE — Therapy (Signed)
  OUTPATIENT PHYSICAL THERAPY SOZO SCREENING NOTE   Patient Name: [name] MRN: [id number] DOB:Feb 18, 1966, 58 y.o., female Today's Date: 10/06/2023  PCP: Jodie Lavern CROME, MD REFERRING PROVIDER: Aron Shoulders, MD   PT End of Session - 10/06/23 [phone]     Visit Number 3   # unchanged due to screen only   PT Start Time 0834    PT Stop Time 0838    PT Time Calculation (min) 4 min    Activity Tolerance Patient tolerated treatment well    Behavior During Therapy [facility] for tasks assessed/performed             Past Medical History:  Diagnosis Date   Allergy    seasonal allergies   Benign colon polyp 10/25/2020   07/2020, Dr. Avram; q 10 yr recall   Esophageal reflux    hx of-with certain foods   Frequent UTI    Heart murmur    current   Past Surgical History:  Procedure Laterality Date   BREAST BIOPSY Left 05/27/2023   US  LT BREAST BX W LOC DEV 1ST LESION IMG BX SPEC US  GUIDE 05/27/2023 [facility] MAMMOGRAPHY   BREAST BIOPSY Left 07/01/2023   US  LT RADIOACTIVE SEED LOC 07/01/2023 [facility] ULTRASOUND   BREAST LUMPECTOMY WITH RADIOACTIVE SEED AND SENTINEL LYMPH NODE BIOPSY Left 07/02/2023   Procedure: LEFT BREAST SEED LOCALIZED LUMPECTOMY WITH SENTINEL NODE BIOPSY;  Surgeon: Ebbie Cough, MD;  Location: [facility];  Service: General;  Laterality: Left;  LMA PEC BLOCK   CHOLECYSTECTOMY     2008   DILATION AND CURETTAGE OF UTERUS     WISDOM TOOTH EXTRACTION     Patient Active Problem List   Diagnosis Date Noted   Malignant neoplasm of upper-outer quadrant of left breast in female, estrogen receptor positive (HCC) 06/02/2023   Benign colon polyp 10/25/2020   Secondary insomnia 10/25/2020   Multinodular goiter 10/12/2020   GERD (gastroesophageal reflux disease) 07/28/2008    REFERRING DIAG: left breast cancer at risk for lymphedema  THERAPY DIAG:  Aftercare following surgery for neoplasm  PERTINENT HISTORY: Patient was diagnosed on 05/28/2023 with left  grade III invasive ductal carcinoma breast cancer. She underwent a left lumpectomy and sentinel node biopsy (1 negative node) on 07/02/2023. It is ER/PR positive and HER2 negative with a Ki67 of 40%.   PRECAUTIONS: left UE Lymphedema risk, None  SUBJECTIVE: Pt returns for her first 3 month L-Dex screen.  PAIN:  Are you having pain? No  SOZO SCREENING: Patient was assessed today using the SOZO machine to determine the lymphedema index score. This was compared to her baseline score. It was determined that she is within the recommended range when compared to her baseline and no further action is needed at this time. She will continue SOZO screenings. These are done every 3 months for 2 years post operatively followed by every 6 months for 2 years, and then annually.   L-DEX FLOWSHEETS - 10/06/23 0800       L-DEX LYMPHEDEMA SCREENING   Measurement Type Unilateral    L-DEX MEASUREMENT EXTREMITY Upper Extremity    POSITION  Standing    DOMINANT SIDE Right    At Risk Side Left    BASELINE SCORE (UNILATERAL) -0.9    L-DEX SCORE (UNILATERAL) -1.6    VALUE CHANGE (UNILAT) -0.7              Aden Berwyn Caldron, PTA 10/06/2023, 8:37 AM

## 2023-10-14 ENCOUNTER — Telehealth: Payer: Self-pay | Admitting: *Deleted

## 2023-10-14 ENCOUNTER — Ambulatory Visit (HOSPITAL_BASED_OUTPATIENT_CLINIC_OR_DEPARTMENT_OTHER)
Admission: RE | Admit: 2023-10-14 | Discharge: 2023-10-14 | Disposition: A | Discharge status: Home / Self Care | Payer: BC Managed Care – PPO | Source: Ambulatory Visit | Attending: Hematology and Oncology | Admitting: Hematology and Oncology

## 2023-10-14 DIAGNOSIS — Z780 Asymptomatic menopausal state: Secondary | ICD-10-CM | POA: Insufficient documentation

## 2023-10-14 DIAGNOSIS — M810 Age-related osteoporosis without current pathological fracture: Secondary | ICD-10-CM | POA: Diagnosis not present

## 2023-10-14 NOTE — Telephone Encounter (Signed)
Per Dr.Gudena, called pt with message below. Pt is in agreement to start Fosamax weekly. She will be having dental work in February and I have made provider aware. Awaiting providers recommendation of when to start. Pt verbalized understanding

## 2023-10-14 NOTE — Telephone Encounter (Signed)
-----   Message from Tamsen Meek sent at 10/14/2023  2:11 PM EST ----- Regarding: Osteoporosis This patient has osteoporosis with a T-score of -3.1. Please inform her that she needs to take bisphosphonate therapy.  If she is agreeable sent prescription for Fosamax 70 mg once a week.  She needs to understand that she cannot have any dental procedures while on Fosamax.  She needs to take it with a large amount of water and not lie down after taking it. Thanks [employer] ----- Message ----- From: Leory Plowman, Rad Results In Sent: 10/14/2023  12:51 PM EST To: [name], MD

## 2023-10-17 ENCOUNTER — Encounter: Payer: Self-pay | Admitting: Radiation Oncology

## 2023-10-17 ENCOUNTER — Telehealth: Payer: Self-pay | Admitting: *Deleted

## 2023-10-17 NOTE — Telephone Encounter (Signed)
Verbal orders received for pt to be prescribed Fosamax 70 mg weekly and avoid any major dental procedures while taking therapy.  RN contacted pt who states she is scheduled for major dental work in a few weeks.  Per MD okay for pt to undergo dental work and discuss Fosamax during survivorship visit.

## 2023-10-18 NOTE — Progress Notes (Incomplete)
  Radiation Oncology         [phone] ________________________________  Name: MAIJA BIGGERS MRN: [id number]  Date: 10/20/2023  DOB: [birth year]  End of Treatment Note  Diagnosis: Stage IA (cT1c, cN0, cM0) Left Breast UOQ, Invasive ductal carcinoma with intermediate grade DCIS, ER+ / PR+ / Her2-, Grade 3 : s/p left breast lumpectomy and SLN biopsies with clear margins and negative nodes      Indication for treatment: Curative        Radiation treatment dates: First Treatment Date: 2023-08-26 - Last Treatment Date: 2023-09-16  Site/Dose/Technique/Mode:   Site: Breast, Left Technique: 3D Mode: Photon Dose Per Fraction: 2.67 Gy Prescribed Dose (Delivered / Prescribed): 42.72 Gy / 42.72 Gy Prescribed Fxs (Delivered / Prescribed): 16 / 16  Narrative: The patient tolerated radiation treatment relatively well.  During her final weekly treatment check on 09/09/23, the patient endorsed mild fatigue and denied any other concerns. Physical exam performed that same date showed some hyperpigmentation changes and mild erythema to the left breast area, and a persistent palpable hematoma in the upper central breast region which was very slow to resolve.   Plan: The patient has completed radiation treatment. The patient will return to radiation oncology clinic for routine followup in one month. I advised them to call or return sooner if they have any questions or concerns related to their recovery or treatment.  -----------------------------------  Billie Lade, PhD, MD  This document serves as a record of services personally performed by Antony Blackbird, MD. It was created on his behalf by Neena Rhymes, a trained medical scribe. The creation of this record is based on the scribe's personal observations and the provider's statements to them. This document has been checked and approved by the attending provider.

## 2023-10-18 NOTE — Progress Notes (Incomplete)
Radiation Oncology         [phone] ________________________________  Name: [name] MRN: [id number]  Date: 10/20/2023  DOB: [birth year]  Follow-Up Visit Note  CC: [name], MD  [name], MD  No diagnosis found.  Diagnosis: Stage IA (cT1c, cN0, cM0) Left Breast UOQ, Invasive ductal carcinoma with intermediate grade DCIS, ER+ / PR+ / Her2-, Grade 3 : s/p left breast lumpectomy and SLN biopsies with clear margins and negative nodes     Interval Since Last Radiation: 1 month and 3 days   Indication for treatment: Curative         Radiation treatment dates: First Treatment Date: 2023-08-26 - Last Treatment Date: 2023-09-16   Site/Dose/Technique/Mode:    Site: Breast, Left Technique: 3D Mode: Photon Dose Per Fraction: 2.67 Gy Prescribed Dose (Delivered / Prescribed): 42.72 Gy / 42.72 Gy Prescribed Fxs (Delivered / Prescribed): 16 / 16  Narrative:  The patient returns today for routine follow-up. She tolerated radiation treatment relatively well.  During her final weekly treatment check on 09/09/23, the patient endorsed mild fatigue and denied any other concerns. Physical exam performed that same date showed some hyperpigmentation changes and mild erythema to the left breast area, and a persistent palpable hematoma in the upper central breast region which was very slow to resolve.  In the interval, she recently followed up with Dr. Pamelia Hoit on 09/15/23. She was noted to be doing well at that time and consented to starting antiestrogen therapy consisting of anastrozole. Planned duration is for 7 years.    Pertinent imaging performed in the interval since her consultation date includes a bone density scan on 10/13/22 which classified the patient as osteoporotic.   No other significant interval history since the patient completed radiation therapy.   ***                              Allergies:  has no known allergies.  Meds: Current Outpatient Medications   Medication Sig Dispense Refill   anastrozole (ARIMIDEX) 1 MG tablet Take 1 tablet (1 mg total) by mouth daily. 90 tablet 3   triamcinolone ointment (KENALOG) 0.5 % Apply 1 Application topically 2 (two) times daily. 15 g 0   No current facility-administered medications for this encounter.    Physical Findings: The patient is in no acute distress. Patient is alert and oriented.  vitals were not taken for this visit. .  No significant changes. Lungs are clear to auscultation bilaterally. Heart has regular rate and rhythm. No palpable cervical, supraclavicular, or axillary adenopathy. Abdomen soft, non-tender, normal bowel sounds.  Right Breast: no palpable mass, nipple discharge or bleeding. Left Breast: ***  Lab Findings: Lab Results  Component Value Date   WBC 5.0 06/04/2023   HGB 14.4 06/04/2023   HCT 43.8 06/04/2023   MCV 90.5 06/04/2023   PLT 302 06/04/2023    Radiographic Findings: DG Bone Density Result Date: 10/14/2023 EXAM: DUAL X-RAY ABSORPTIOMETRY (DXA) FOR BONE MINERAL DENSITY IMPRESSION: Referring Physician:  [name] Your patient completed a BMD test using Lunar IDXA DXA system (analysis version: 18sp4) manufactured by [employer]. Technologist: ALW PATIENT: Name: Jessica, Neal Patient ID: [id number] Birth Date: [birth year] Height: 67.0 in. Sex: Female Measured: 10/14/2023 Weight: 134.4 lbs. Indications: Breast Cancer History, Caucasian, Estrogen Deficiency, Family History of Osteoporosis, Post Menopausal Fractures: Treatments: ASSESSMENT: The BMD measured at AP Spine L1-L4 is 0.806 g/cm2 with a T-score of -3.1.  This patient is considered osteoporotic according to World Health Organization [facility]) criteria. The scan quality is good. Site Region Measured Date Measured Age WHO YA BMD Classification T-score AP Spine L1-L4 10/14/2023 57.3 Osteoporosis -3.1 0.806 g/cm2 DualFemur Total Right 10/14/2023 57.3 Osteoporosis -2.9 0.644 g/cm2 DualFemur Total Mean 10/14/2023 57.3  Osteoporosis -2.9 0.646 g/cm2 World Health Organization [facility]) criteria for post-menopausal, Caucasian Women: Normal       T-score at or above -1 SD Osteopenia   T-score between -1 and -2.5 SD Osteoporosis T-score at or below -2.5 SD RECOMMENDATION: 1. All patients should optimize calcium and vitamin D intake. 2. Consider FDA approved medical therapies in postmenopausal women and men aged 27 years and older, based on the following: a. A hip or vertebral (clinical or morphometric) fracture b. T-score = -2.5 at the femoral neck or spine after appropriate evaluation to exclude secondary causes c. Low bone mass (T-score between -1.0 and -2.5 at the femoral neck or spine) and a 10- year probability of a hip fracture = 3% or a 10 year probability of a major osteoporosis-related fracture = 20% based on the [place]-adapted WHO algorithm. 3. Clinician judgement and/or patient preference may indicate treatment for people with 10-year fracture probabilities above or below these levels. FOLLOW-UP: Patients with diagnosis of osteoporosis or at high risk for fracture should have regular bone mineral density tests. For patients eligible for Medicare routine testing is allowed once every 2 years. The testing frequency can be increased to one year for patients who have rapidly progressing disease, those who are receiving or discontinuing medical therapy to restore bone mass, or have additional risk factors. I have reviewed this report and agree with the above findings. [facility] Electronically Signed   By: Frederico Hamman M.D.   On: 10/14/2023 12:49    Impression:  Stage IA (cT1c, cN0, cM0) Left Breast UOQ, Invasive ductal carcinoma with intermediate grade DCIS, ER+ / PR+ / Her2-, Grade 3 : s/p left breast lumpectomy and SLN biopsies with clear margins and negative nodes     The patient is recovering from the effects of radiation.  ***  Plan:  ***   *** minutes of total time was spent for this patient encounter,  including preparation, face-to-face counseling with the patient and coordination of care, physical exam, and documentation of the encounter. ____________________________________  Billie Lade, PhD, MD  This document serves as a record of services personally performed by Antony Blackbird, MD. It was created on his behalf by Neena Rhymes, a trained medical scribe. The creation of this record is based on the scribe's personal observations and the provider's statements to them. This document has been checked and approved by the attending provider.

## 2023-10-19 DIAGNOSIS — R059 Cough, unspecified: Secondary | ICD-10-CM | POA: Diagnosis not present

## 2023-10-19 DIAGNOSIS — J111 Influenza due to unidentified influenza virus with other respiratory manifestations: Secondary | ICD-10-CM | POA: Diagnosis not present

## 2023-10-20 ENCOUNTER — Telehealth: Payer: Self-pay | Admitting: *Deleted

## 2023-10-20 ENCOUNTER — Telehealth: Payer: Self-pay | Admitting: Radiation Oncology

## 2023-10-20 ENCOUNTER — Ambulatory Visit
Admission: RE | Admit: 2023-10-20 | Discharge: 2023-10-20 | Disposition: A | Discharge status: Home / Self Care | Payer: BC Managed Care – PPO | Source: Ambulatory Visit | Attending: Radiation Oncology | Admitting: Radiation Oncology

## 2023-10-20 HISTORY — DX: Personal history of irradiation: Z92.3

## 2023-10-20 NOTE — Telephone Encounter (Signed)
RETURNED PATIENT'S PHONE CALL, SPOKE WITH PATIENT. ?

## 2023-10-20 NOTE — Telephone Encounter (Signed)
Pt called to advise she has the flu and is needing to r/s f/u 15 with Dr. Roselind Messier. Pt was transferred to [facility] for r/s

## 2023-10-29 NOTE — Progress Notes (Signed)
 Radiation Oncology         [phone] ________________________________  Name: [name] MRN: [id number]  Date: 10/30/2023  DOB: 05-14-1966  Follow-Up Visit Note  CC: Jodie Lavern CROME, MD  Jodie Lavern CROME, MD    ICD-10-CM   1. Malignant neoplasm of upper-outer quadrant of left breast in female, estrogen receptor positive (HCC) [C50.412, Z17.0]  C50.412    Z17.0       Diagnosis: Stage IA (cT1c, cN0, cM0) Left Breast UOQ, Invasive ductal carcinoma with intermediate grade DCIS, ER+ / PR+ / Her2-, Grade 3 : s/p left breast lumpectomy and SLN biopsies with clear margins and negative nodes     Interval Since Last Radiation: 1 month and 13 days   Indication for treatment: Curative         Radiation treatment dates: First Treatment Date: 2023-08-26 - Last Treatment Date: 2023-09-16   Site/Dose/Technique/Mode:    Site: Breast, Left Technique: 3D Mode: Photon Dose Per Fraction: 2.67 Gy Prescribed Dose (Delivered / Prescribed): 42.72 Gy / 42.72 Gy Prescribed Fxs (Delivered / Prescribed): 16 / 16  Narrative:  The patient returns today for routine follow-up. She tolerated radiation treatment relatively well.  During her final weekly treatment check on 09/09/23, the patient endorsed mild fatigue and denied any other concerns. Physical exam performed that same date showed some hyperpigmentation changes and mild erythema to the left breast area, and a persistent palpable hematoma in the upper central breast region.  In the interval, she recently followed up with Dr. Odean on 09/15/23. She was noted to be doing well at that time and consented to starting antiestrogen therapy consisting of anastrozole . Planned duration is for 7 years.    Pertinent imaging performed in the interval since her consultation date includes a bone density scan on 10/13/22 which classified the patient as osteoporotic.   Today the patient states to be doing well overall. She is pleased with how her skin has  healed since completing treatment. She notes a residual hematoma in her left breast. She feels as though her energy level is continuing to improve.                              Allergies:  has no known allergies.  Meds: Current Outpatient Medications  Medication Sig Dispense Refill   anastrozole  (ARIMIDEX ) 1 MG tablet Take 1 tablet (1 mg total) by mouth daily. 90 tablet 3   triamcinolone  ointment (KENALOG ) 0.5 % Apply 1 Application topically 2 (two) times daily. 15 g 0   No current facility-administered medications for this encounter.    Physical Findings: The patient is in no acute distress. Patient is alert and oriented.  height is 5' 7 (1.702 m) and weight is 135 lb 2 oz (61.3 kg). Her temporal temperature is 97.5 F (36.4 C) (abnormal). Her blood pressure is 124/72 and her pulse is 68. Her respiration is 18 and oxygen saturation is 99%. .  No significant changes. Lungs are clear to auscultation bilaterally. Heart has regular rate and rhythm. No palpable cervical, supraclavicular, or axillary adenopathy. Abdomen soft, non-tender, normal bowel sounds.  Right Breast: no palpable mass, nipple discharge or bleeding. Left Breast: palpable hematoma below the lumpectomy incision. Incision is well healed with no signs of infection. Minor hyperpigmentation within the treatment field.   Lab Findings: Lab Results  Component Value Date   WBC 5.0 06/04/2023   HGB 14.4 06/04/2023   HCT 43.8 06/04/2023  MCV 90.5 06/04/2023   PLT 302 06/04/2023    Radiographic Findings: DG Bone Density Result Date: 10/14/2023 EXAM: DUAL X-RAY ABSORPTIOMETRY (DXA) FOR BONE MINERAL DENSITY IMPRESSION: Referring Physician:  MACKEY CHAD Your patient completed a BMD test using Lunar IDXA DXA system (analysis version: 18sp4) manufactured by [employer]. Technologist: ALW PATIENT: Name: Maronda, Caison Patient ID: [id number] Birth Date: Jun 22, 1966 Height: 67.0 in. Sex: Female Measured: 10/14/2023 Weight: 134.4 lbs.  Indications: Breast Cancer History, Caucasian, Estrogen Deficiency, Family History of Osteoporosis, Post Menopausal Fractures: Treatments: ASSESSMENT: The BMD measured at AP Spine L1-L4 is 0.806 g/cm2 with a T-score of -3.1. This patient is considered osteoporotic according to World Health Organization [facility]) criteria. The scan quality is good. Site Region Measured Date Measured Age WHO YA BMD Classification T-score AP Spine L1-L4 10/14/2023 57.3 Osteoporosis -3.1 0.806 g/cm2 DualFemur Total Right 10/14/2023 57.3 Osteoporosis -2.9 0.644 g/cm2 DualFemur Total Mean 10/14/2023 57.3 Osteoporosis -2.9 0.646 g/cm2 World Health Organization [facility]) criteria for post-menopausal, Caucasian Women: Normal       T-score at or above -1 SD Osteopenia   T-score between -1 and -2.5 SD Osteoporosis T-score at or below -2.5 SD RECOMMENDATION: 1. All patients should optimize calcium and vitamin D  intake. 2. Consider FDA approved medical therapies in postmenopausal women and men aged 37 years and older, based on the following: a. A hip or vertebral (clinical or morphometric) fracture b. T-score = -2.5 at the femoral neck or spine after appropriate evaluation to exclude secondary causes c. Low bone mass (T-score between -1.0 and -2.5 at the femoral neck or spine) and a 10- year probability of a hip fracture = 3% or a 10 year probability of a major osteoporosis-related fracture = 20% based on the [place] -adapted WHO algorithm. 3. Clinician judgement and/or patient preference may indicate treatment for people with 10-year fracture probabilities above or below these levels. FOLLOW-UP: Patients with diagnosis of osteoporosis or at high risk for fracture should have regular bone mineral density tests. For patients eligible for Medicare routine testing is allowed once every 2 years. The testing frequency can be increased to one year for patients who have rapidly progressing disease, those who are receiving or discontinuing medical therapy to restore  bone mass, or have additional risk factors. I have reviewed this report and agree with the above findings. [facility] Electronically Signed   By: Rosaline Collet M.D.   On: 10/14/2023 12:49    Impression/Plan:  Stage IA (cT1c, cN0, cM0) Left Breast UOQ, Invasive ductal carcinoma with intermediate grade DCIS, ER+ / PR+ / Her2-, Grade 3 : s/p left breast lumpectomy and SLN biopsies with clear margins and negative nodes     The patient is doing well overall and continues to recover from the effects of her radiation treatment. She will continue on anastrozole  under the care of Gudena. She is scheduled to see Morna Kendall for survivorship clinic on 12/15/23. Radiation follow-up PRN. She has been encouraged to call back with any questions or concerns. We appreciate the opportunity to take part in this patient's care.     20 minutes of total time was spent for this patient encounter, including preparation, face-to-face counseling with the patient and coordination of care, physical exam, and documentation of the encounter. ____________________________________   Leeroy Due, PA-C  This document serves as a record of services personally performed by Leeroy Due, PA-C. It was created on his behalf by Reymundo Cartwright, a trained medical scribe. The creation of this record is based on the  scribe's personal observations and the provider's statements to them. This document has been checked and approved by the attending provider.

## 2023-10-29 NOTE — Progress Notes (Signed)
  Radiation Oncology         [phone] ________________________________  Name: [name] MRN: [id number]  Date: 10/30/2023  DOB: 1966-04-12  End of Treatment Note  Diagnosis: Stage IA (cT1c, cN0, cM0) Left Breast UOQ, Invasive ductal carcinoma with intermediate grade DCIS, ER+ / PR+ / Her2-, Grade 3 : s/p left breast lumpectomy and SLN biopsies with clear margins and negative nodes      Indication for treatment: Curative        Radiation treatment dates: First Treatment Date: 2023-08-26 - Last Treatment Date: 2023-09-16  Site/Dose/Technique/Mode:   Site: Breast, Left Technique: 3D Mode: Photon Dose Per Fraction: 2.67 Gy Prescribed Dose (Delivered / Prescribed): 42.72 Gy / 42.72 Gy Prescribed Fxs (Delivered / Prescribed): 16 / 16  Narrative: The patient tolerated radiation treatment relatively well.  During her final weekly treatment check on 09/09/23, the patient endorsed mild fatigue and denied any other concerns. Physical exam performed that same date showed some hyperpigmentation changes and mild erythema to the left breast area, and a persistent palpable hematoma in the upper central breast region which was very slow to resolve.   Plan: The patient has completed radiation treatment. The patient will return to radiation oncology clinic for routine followup in one month. I advised them to call or return sooner if they have any questions or concerns related to their recovery or treatment.  -----------------------------------  Lynwood CHARM Nasuti, PhD, MD  This document serves as a record of services personally performed by Lynwood Nasuti, MD. It was created on his behalf by Reymundo Cartwright, a trained medical scribe. The creation of this record is based on the scribe's personal observations and the provider's statements to them. This document has been checked and approved by the attending provider.

## 2023-10-30 ENCOUNTER — Ambulatory Visit
Admission: RE | Admit: 2023-10-30 | Discharge: 2023-10-30 | Disposition: A | Discharge status: Home / Self Care | Payer: BC Managed Care – PPO | Source: Ambulatory Visit | Attending: Radiation Oncology | Admitting: Radiation Oncology

## 2023-10-30 ENCOUNTER — Encounter: Payer: Self-pay | Admitting: Radiation Oncology

## 2023-10-30 VITALS — BP 124/72 | HR 68 | Temp 97.5°F | Resp 18 | Ht 67.0 in | Wt 135.1 lb

## 2023-10-30 DIAGNOSIS — Z923 Personal history of irradiation: Secondary | ICD-10-CM | POA: Insufficient documentation

## 2023-10-30 DIAGNOSIS — C50412 Malignant neoplasm of upper-outer quadrant of left female breast: Secondary | ICD-10-CM | POA: Insufficient documentation

## 2023-10-30 DIAGNOSIS — Z170 Estrogen receptor positive status [ER+]: Secondary | ICD-10-CM | POA: Diagnosis not present

## 2023-10-30 NOTE — Progress Notes (Signed)
 [name] is here today for follow up post radiation to the breast.   Breast Side: Left    They completed their radiation on: 09/16/23   Does the patient complain of any of the following: Post radiation skin issues: Yes, continues to have peeling to left breast. Patient requesting more Radiaplex.  Breast Tenderness: Yes Breast Swelling:  Yes, mild continues to have hematoma to breast.  Lymphadema: No Range of Motion limitations: No Fatigue post radiation:Energy level is improving.  Appetite good/fair/poor: Fair  Additional comments if applicable:  Patient started anastrozole  2 weeks ago. Reports tolerating well.   BP 124/72 (BP Location: Left Arm, Patient Position: Sitting)   Pulse 68   Temp (!) 97.5 F (36.4 C) (Temporal)   Resp 18   Ht 5' 7 (1.702 m)   Wt 135 lb 2 oz (61.3 kg)   LMP 04/24/2015   SpO2 99%   BMI 21.16 kg/m

## 2023-11-03 DIAGNOSIS — Z124 Encounter for screening for malignant neoplasm of cervix: Secondary | ICD-10-CM | POA: Diagnosis not present

## 2023-11-03 DIAGNOSIS — Z01411 Encounter for gynecological examination (general) (routine) with abnormal findings: Secondary | ICD-10-CM | POA: Diagnosis not present

## 2023-11-03 DIAGNOSIS — Z1151 Encounter for screening for human papillomavirus (HPV): Secondary | ICD-10-CM | POA: Diagnosis not present

## 2023-11-03 DIAGNOSIS — N6489 Other specified disorders of breast: Secondary | ICD-10-CM | POA: Diagnosis not present

## 2023-11-03 DIAGNOSIS — M810 Age-related osteoporosis without current pathological fracture: Secondary | ICD-10-CM | POA: Diagnosis not present

## 2023-11-06 DIAGNOSIS — D225 Melanocytic nevi of trunk: Secondary | ICD-10-CM | POA: Diagnosis not present

## 2023-11-06 DIAGNOSIS — L570 Actinic keratosis: Secondary | ICD-10-CM | POA: Diagnosis not present

## 2023-11-06 DIAGNOSIS — L918 Other hypertrophic disorders of the skin: Secondary | ICD-10-CM | POA: Diagnosis not present

## 2023-11-06 DIAGNOSIS — L814 Other melanin hyperpigmentation: Secondary | ICD-10-CM | POA: Diagnosis not present

## 2023-11-06 DIAGNOSIS — Z85828 Personal history of other malignant neoplasm of skin: Secondary | ICD-10-CM | POA: Diagnosis not present

## 2023-11-12 ENCOUNTER — Encounter: Payer: BC Managed Care – PPO | Admitting: Family Medicine

## 2023-12-01 ENCOUNTER — Encounter: Payer: Self-pay | Admitting: Family Medicine

## 2023-12-01 ENCOUNTER — Ambulatory Visit (INDEPENDENT_AMBULATORY_CARE_PROVIDER_SITE_OTHER): Payer: BC Managed Care – PPO | Admitting: Family Medicine

## 2023-12-01 VITALS — BP 136/83 | HR 71 | Temp 97.8°F | Ht 67.0 in | Wt 137.4 lb

## 2023-12-01 DIAGNOSIS — E042 Nontoxic multinodular goiter: Secondary | ICD-10-CM | POA: Diagnosis not present

## 2023-12-01 DIAGNOSIS — Z23 Encounter for immunization: Secondary | ICD-10-CM | POA: Diagnosis not present

## 2023-12-01 DIAGNOSIS — M818 Other osteoporosis without current pathological fracture: Secondary | ICD-10-CM | POA: Insufficient documentation

## 2023-12-01 DIAGNOSIS — K219 Gastro-esophageal reflux disease without esophagitis: Secondary | ICD-10-CM

## 2023-12-01 DIAGNOSIS — G4709 Other insomnia: Secondary | ICD-10-CM

## 2023-12-01 DIAGNOSIS — Z0000 Encounter for general adult medical examination without abnormal findings: Secondary | ICD-10-CM | POA: Diagnosis not present

## 2023-12-01 DIAGNOSIS — Z0001 Encounter for general adult medical examination with abnormal findings: Secondary | ICD-10-CM

## 2023-12-01 DIAGNOSIS — Z13220 Encounter for screening for lipoid disorders: Secondary | ICD-10-CM

## 2023-12-01 DIAGNOSIS — C50412 Malignant neoplasm of upper-outer quadrant of left female breast: Secondary | ICD-10-CM | POA: Diagnosis not present

## 2023-12-01 DIAGNOSIS — K635 Polyp of colon: Secondary | ICD-10-CM

## 2023-12-01 DIAGNOSIS — Z170 Estrogen receptor positive status [ER+]: Secondary | ICD-10-CM

## 2023-12-01 LAB — COMPREHENSIVE METABOLIC PANEL
ALT: 32 U/L (ref 0–35)
AST: 23 U/L (ref 0–37)
Albumin: 4.5 g/dL (ref 3.5–5.2)
Alkaline Phosphatase: 78 U/L (ref 39–117)
BUN: 12 mg/dL (ref 6–23)
CO2: 32 meq/L (ref 19–32)
Calcium: 9.7 mg/dL (ref 8.4–10.5)
Chloride: 102 meq/L (ref 96–112)
Creatinine, Ser: 0.56 mg/dL (ref 0.40–1.20)
GFR: 101.27 mL/min (ref >60.00)
Glucose, Bld: 72 mg/dL (ref 70–99)
Potassium: 3.7 meq/L (ref 3.5–5.1)
Sodium: 142 meq/L (ref 135–145)
Total Bilirubin: 0.4 mg/dL (ref 0.2–1.2)
Total Protein: 7.1 g/dL (ref 6.0–8.3)

## 2023-12-01 LAB — CBC WITH DIFFERENTIAL/PLATELET
Basophils Absolute: 0 10*3/uL (ref 0.0–0.1)
Basophils Relative: 0.4 % (ref 0.0–3.0)
Eosinophils Absolute: 0 10*3/uL (ref 0.0–0.7)
Eosinophils Relative: 0.9 % (ref 0.0–5.0)
HCT: 41.7 % (ref 36.0–46.0)
Hemoglobin: 13.9 g/dL (ref 12.0–15.0)
Lymphocytes Relative: 32.8 % (ref 12.0–46.0)
Lymphs Abs: 1.5 10*3/uL (ref 0.7–4.0)
MCHC: 33.2 g/dL (ref 30.0–36.0)
MCV: 90.8 fl (ref 78.0–100.0)
Monocytes Absolute: 0.6 10*3/uL (ref 0.1–1.0)
Monocytes Relative: 12 % (ref 3.0–12.0)
Neutro Abs: 2.5 10*3/uL (ref 1.4–7.7)
Neutrophils Relative %: 53.9 % (ref 43.0–77.0)
Platelets: 272 10*3/uL (ref 150.0–400.0)
RBC: 4.59 Mil/uL (ref 3.87–5.11)
RDW: 13.5 % (ref 11.5–15.5)
WBC: 4.6 10*3/uL (ref 4.0–10.5)

## 2023-12-01 LAB — TSH: TSH: 0.93 u[IU]/mL (ref 0.35–5.50)

## 2023-12-01 LAB — LIPID PANEL
Cholesterol: 207 mg/dL — ABNORMAL HIGH (ref 0–200)
HDL: 47.1 mg/dL (ref >39.00)
LDL Cholesterol: 143 mg/dL — ABNORMAL HIGH (ref 0–99)
NonHDL: 159.47
Total CHOL/HDL Ratio: 4
Triglycerides: 83 mg/dL (ref 0.0–149.0)
VLDL: 16.6 mg/dL (ref 0.0–40.0)

## 2023-12-01 MED ORDER — ALPRAZOLAM 0.5 MG PO TABS: 0.5000 mg | ORAL_TABLET | Freq: Every evening | ORAL | 0 refills | Status: DC | PRN

## 2023-12-01 NOTE — Progress Notes (Signed)
 Labs reviewed.  The 10-year ASCVD risk score (Arnett DK, et al., 2019) is: 3.2%   Values used to calculate the score:     Age: 58 years     Sex: Female     Is Non-Hispanic African American: No     Diabetic: No     Tobacco smoker: No     Systolic Blood Pressure: 136 mmHg     Is BP treated: No     HDL Cholesterol: 47.1 mg/dL     Total Cholesterol: 207 mg/dL  Dear [name], Thank you for allowing me to care for you at your recent office visit.  I wanted to let you know that I have reviewed your lab test results and am happy to report that they are all stable.  These look good.  It was so good seeing you today; may 2025 continue to improve over 2024 for you.  Let me know if you need anything.   Sincerely, Dr. Mardelle Matte

## 2023-12-01 NOTE — Progress Notes (Signed)
 Subjective  Chief Complaint  Patient presents with   Annual Exam    HPI: [name] is a 58 y.o. female who presents to [facility] [place] today for a Female Wellness Visit. She also has the concerns and/or needs as listed above in the chief complaint. These will be addressed in addition to the Health Maintenance Visit.   Wellness Visit: annual visit with health maintenance review and exam  Health maintenance: Last here in 2022.  Reviewed history over the last few years.  Last year diagnosed with breast cancer and status post left lumpectomy and radiation treatment.  Estrogen receptor positive and now on Arimidex.  Fortunately started to improve.  Pap smear was done last month with her GYN.  Reportedly normal.  We have called for results.  Colonoscopy is current.  Immunizations: Eligible for flu vaccination although she will get flu last month, Shingrix and Prevnar 20 due to her history of breast cancer. Chronic disease f/u and/or acute problem visit: (deemed necessary to be done in addition to the wellness visit): Secondary insomnia: Has always been a stressor and has problems with sleep.  Has failed trazodone.  Has had a difficult year but feels like she is coping well.  No mood concerns.  He has used intermittent Xanax successfully in the past. Multinodular goiter without any obstructive symptoms.  No symptoms of high or low thyroid.  Had ultrasound in 2023 showing resolution of colloid cyst.  No further imaging surveillance recommended at that time. GERD is controlled. New diagnosis of osteoporosis.  Reviewed bone density done in January.  Lowest T equals -3.1 at the L-spine.  T equals -2.9 at her femur.  Her mother has osteoporosis and recently suffered a femoral fracture.  Fortunately she is recovering.  Patient is now on calcium, vitamin D and doing strength training.  She will start Fosamax when she has dental work completed.  Assessment  1. Encounter for well  adult exam with abnormal findings   2. Secondary insomnia   3. Multinodular goiter   4. Malignant neoplasm of upper-outer quadrant of left breast in female, estrogen receptor positive (HCC)   5. Gastroesophageal reflux disease without esophagitis   6. Benign colon polyp   7. Need for pneumococcal 20-valent conjugate vaccination   8. Other osteoporosis without current pathological fracture      Plan  Female Wellness Visit: Age appropriate Health Maintenance and Prevention measures were discussed with patient. Included topics are cancer screening recommendations, ways to keep healthy (see AVS) including dietary and exercise recommendations, regular eye and dental care, use of seat belts, and avoidance of moderate alcohol use and tobacco use.  Screens are current.  Will get Pap smear report BMI: discussed patient's BMI and encouraged positive lifestyle modifications to help get to or maintain a target BMI. HM needs and immunizations were addressed and ordered. See below for orders. See HM and immunization section for updates.  Prevnar 20 given today.  Patient will return in 2 to 3 weeks for her first Shingrix vaccination.  She defers flu vaccine at this time.  This is reasonable Routine labs and screening tests ordered including cmp, cbc and lipids where appropriate. Discussed recommendations regarding Vit D and calcium supplementation (see AVS)  Chronic disease management visit and/or acute problem visit: Osteoporosis: Educated on diagnosis and treatment options.  Agree with starting Fosamax.  Recheck in 2 years.  Continue calcium vitamin D and strength training Breast cancer: Has completed treatment.  Fortunately is recovering.  Continue Arimidex.  Will follow along with oncology GERD: Monitor while starting Fosamax. Insomnia: Xanax as needed.  Education given.  Monitor for mood concerns given her stressful year.  Follow up: 1 year for complete physical Orders Placed This Encounter   Procedures   Pneumococcal conjugate vaccine 20-valent (Prevnar 20)   CBC with Differential/Platelet   Comprehensive metabolic panel   Lipid panel   TSH   Meds ordered this encounter  Medications   ALPRAZolam (XANAX) 0.5 MG tablet    Sig: Take 1 tablet (0.5 mg total) by mouth at bedtime as needed for sleep.    Dispense:  30 tablet    Refill:  0      Body mass index is 21.52 kg/m. Wt Readings from Last 3 Encounters:  12/01/23 137 lb 6.4 oz (62.3 kg)  10/30/23 135 lb 2 oz (61.3 kg)  10/06/23 134 lb 6 oz (61 kg)     Patient Active Problem List   Diagnosis Date Noted Date Diagnosed   Other osteoporosis without current pathological fracture 12/01/2023     Priority: High    Mother with osteoporosis DEXA 2025: lowest T= - 3.1 at L spine, T = -2.9 at femurs; pre-aromatase inhibitor. REC fosamax; to start after dental work.     Malignant neoplasm of upper-outer quadrant of left breast in female, estrogen receptor positive (HCC) 06/02/2023     Priority: High   Multinodular goiter 10/12/2020     Priority: High    Thyroid ultrasound 10/2020: ENT, several goiters: rec annual surveillance Ultrasound 2023: Involution of the right inferior colloid cyst (labeled 2, now 1.1 cm), which now no longer meets criteria for further surveillance, as above.    Secondary insomnia 10/25/2020     Priority: Medium     Failed trazadone    GERD (gastroesophageal reflux disease) 07/28/2008     Priority: Medium    Benign colon polyp 10/25/2020     Priority: Low    07/2020, Dr. Leone Payor; q 10 yr recall    Health Maintenance  Topic Date Due   Cervical Cancer Screening (HPV/Pap Cotest)  01/12/2021   INFLUENZA VACCINE  04/24/2023   COVID-19 Vaccine (3 - Moderna risk series) 12/17/2023 (Originally 04/10/2020)   Zoster Vaccines- Shingrix (1 of 2) 03/02/2024 (Originally 06/10/1985)   MAMMOGRAM  05/11/2024   DEXA SCAN  10/13/2025   Colonoscopy  07/28/2030   DTaP/Tdap/Td (3 - Td or Tdap) 10/17/2031    Pneumococcal Vaccine 33-50 Years old  Completed   Hepatitis C Screening  Completed   HPV VACCINES  Aged Out   HIV Screening  Discontinued   Immunization History  Administered Date(s) Administered   Influenza Split 07/03/2021   Influenza,inj,Quad PF,6+ Mos 10/12/2018, 10/25/2020, 07/19/2021   Moderna Sars-Covid-2 Vaccination 02/14/2020, 03/13/2020   PNEUMOCOCCAL CONJUGATE-20 12/01/2023   Tdap 10/03/2017, 10/16/2021   We updated and reviewed the patient's past history in detail and it is documented below. Allergies: Patient has no known allergies. Past Medical History Patient  has a past medical history of Allergy, Benign colon polyp (10/25/2020), Esophageal reflux, Frequent UTI, Heart murmur, and History of radiation therapy. Past Surgical History Patient  has a past surgical history that includes Cholecystectomy; Wisdom tooth extraction; Dilation and curettage of uterus; Breast biopsy (Left, 05/27/2023); Breast biopsy (Left, 07/01/2023); and Breast lumpectomy with radioactive seed and sentinel lymph node biopsy (Left, 07/02/2023). Family History: Patient family history includes Arthritis in her mother; Dementia in her paternal grandmother; Diabetes in her maternal grandmother and  mother; Healthy in her sister and son; Heart disease in her maternal grandfather; Hypertension in her father and mother; Kidney disease in her maternal grandmother; Lung cancer in her maternal uncle; Pancreatic cancer in her maternal uncle. Social History:  Patient  reports that she has never smoked. She has never used smokeless tobacco. She reports current alcohol use of about 2.0 standard drinks of alcohol per week. She reports that she does not use drugs.  Review of Systems: Constitutional: negative for fever or malaise Ophthalmic: negative for photophobia, double vision or loss of vision Cardiovascular: negative for chest pain, dyspnea on exertion, or new LE swelling Respiratory: negative for SOB or persistent  cough Gastrointestinal: negative for abdominal pain, change in bowel habits or melena Genitourinary: negative for dysuria or gross hematuria, no abnormal uterine bleeding or disharge Musculoskeletal: negative for new gait disturbance or muscular weakness Integumentary: negative for new or persistent rashes, no breast lumps Neurological: negative for TIA or stroke symptoms Psychiatric: negative for SI or delusions Allergic/Immunologic: negative for hives  Patient Care Team    Relationship Specialty Notifications Start End  [name], MD PCP - General Family Medicine  10/25/20   Tracey Harries, MD Consulting Physician Obstetrics and Gynecology  10/25/20   Iva Boop, MD Consulting Physician Gastroenterology  10/25/20   Holli Humbles, MD Consulting Physician Ophthalmology  10/25/20   Aquilla Hacker, PA-C Physician Assistant Otolaryngology  10/25/20   Pershing Proud, RN Oncology Nurse Navigator   06/02/23   Donnelly Angelica, RN Oncology Nurse Navigator   06/02/23   Almond Lint, MD Consulting Physician General Surgery  06/02/23   [name], MD Consulting Physician Hematology and Oncology  06/02/23   Antony Blackbird, MD Consulting Physician Radiation Oncology  06/02/23     Objective  Vitals: BP 136/83   Pulse 71   Temp 97.8 F (36.6 C)   Ht 5\' 7"  (1.702 m)   Wt 137 lb 6.4 oz (62.3 kg)   LMP 04/24/2015   SpO2 98%   BMI 21.52 kg/m  General:  Well developed, well nourished, no acute distress  Psych:  Alert and orientedx3,normal mood and affect HEENT:  Normocephalic, atraumatic, non-icteric sclera,  supple neck without adenopathy, mass or thyromegaly Cardiovascular:  Normal S1, S2, RRR without gallop, rub or murmur Respiratory:  Good breath sounds bilaterally, CTAB with normal respiratory effort Gastrointestinal: normal bowel sounds, soft, non-tender, no noted masses. No HSM MSK: extremities without edema, joints without erythema or swelling Neurologic:    Mental status is normal.   Gross motor and sensory exams are normal.  No tremor  Commons side effects, risks, benefits, and alternatives for medications and treatment plan prescribed today were discussed, and the patient expressed understanding of the given instructions. Patient is instructed to call or message via MyChart if he/she has any questions or concerns regarding our treatment plan. No barriers to understanding were identified. We discussed Red Flag symptoms and signs in detail. Patient expressed understanding regarding what to do in case of urgent or emergency type symptoms.  Medication list was reconciled, printed and provided to the patient in AVS. Patient instructions and summary information was reviewed with the patient as documented in the AVS. This note was prepared with assistance of Dragon voice recognition software. Occasional wrong-word or sound-a-like substitutions may have occurred due to the inherent limitations of voice recognition software

## 2023-12-01 NOTE — Patient Instructions (Signed)
 Please return in 12 months for your annual complete physical; please come fasting. You may schedule a nurse visit to receive your 1st of 2 shingrix (shingle's) vaccinations at your convenience.   I will release your lab results to you on your MyChart account with further instructions. You may see the results before I do, but when I review them I will send you a message with my report or have my assistant call you if things need to be discussed. Please reply to my message with any questions. Thank you!   If you have any questions or concerns, please don't hesitate to send me a message via MyChart or call the office at [phone]. Thank you for visiting with [place] today! It's our pleasure caring for you.   VISIT SUMMARY:  Today, you came in for your annual physical exam. We reviewed your recent treatment for breast cancer, your new diagnosis of osteoporosis, and your sleep disturbances. We also discussed general health maintenance, including vaccinations and routine blood work.  YOUR PLAN:  -BREAST CANCER: You have completed treatment for breast cancer, including surgery and radiation, and are currently taking Arimidex, which is causing some hot flashes. We will continue with the Arimidex as prescribed.  -OSTEOPOROSIS: Osteoporosis is a condition where bones become weak and brittle. You have been diagnosed with this condition and have a family history of it. We will start you on Fosamax after your dental work is completed. Continue taking Vitamin D and calcium supplements, and keep up with your weight-bearing exercises.  -INSOMNIA: Insomnia is difficulty falling or staying asleep. You have occasional trouble sleeping, likely due to stress and anxiety. We will provide you with a small supply of Xanax to use as needed.  -GENERAL HEALTH MAINTENANCE: We administered the Prevnar vaccine today. Consider getting a flu shot next season. We have ordered routine blood work and will follow up in one year or  sooner if needed.  INSTRUCTIONS:  Please follow up in one year for your next annual exam, or sooner if you have any concerns. Make sure to complete your dental work before starting Fosamax. Continue with your current medications and supplements, and use Xanax as needed for sleep disturbances.

## 2023-12-15 ENCOUNTER — Inpatient Hospital Stay: Payer: BC Managed Care – PPO | Attending: Radiation Oncology | Admitting: Adult Health

## 2023-12-15 ENCOUNTER — Other Ambulatory Visit: Payer: Self-pay | Admitting: *Deleted

## 2023-12-15 ENCOUNTER — Encounter: Payer: Self-pay | Admitting: *Deleted

## 2023-12-15 ENCOUNTER — Inpatient Hospital Stay

## 2023-12-15 ENCOUNTER — Encounter: Payer: Self-pay | Admitting: Adult Health

## 2023-12-15 VITALS — BP 128/74 | HR 78 | Temp 97.8°F | Resp 18 | Ht 67.0 in | Wt 137.8 lb

## 2023-12-15 DIAGNOSIS — Z923 Personal history of irradiation: Secondary | ICD-10-CM | POA: Insufficient documentation

## 2023-12-15 DIAGNOSIS — E559 Vitamin D deficiency, unspecified: Secondary | ICD-10-CM | POA: Diagnosis not present

## 2023-12-15 DIAGNOSIS — M818 Other osteoporosis without current pathological fracture: Secondary | ICD-10-CM | POA: Insufficient documentation

## 2023-12-15 DIAGNOSIS — Z170 Estrogen receptor positive status [ER+]: Secondary | ICD-10-CM | POA: Insufficient documentation

## 2023-12-15 DIAGNOSIS — Z1721 Progesterone receptor positive status: Secondary | ICD-10-CM | POA: Insufficient documentation

## 2023-12-15 DIAGNOSIS — C50412 Malignant neoplasm of upper-outer quadrant of left female breast: Secondary | ICD-10-CM | POA: Insufficient documentation

## 2023-12-15 DIAGNOSIS — Z800 Family history of malignant neoplasm of digestive organs: Secondary | ICD-10-CM | POA: Diagnosis not present

## 2023-12-15 DIAGNOSIS — Z79811 Long term (current) use of aromatase inhibitors: Secondary | ICD-10-CM | POA: Diagnosis not present

## 2023-12-15 DIAGNOSIS — Z801 Family history of malignant neoplasm of trachea, bronchus and lung: Secondary | ICD-10-CM | POA: Diagnosis not present

## 2023-12-15 DIAGNOSIS — Z1732 Human epidermal growth factor receptor 2 negative status: Secondary | ICD-10-CM | POA: Insufficient documentation

## 2023-12-15 LAB — VITAMIN D 25 HYDROXY (VIT D DEFICIENCY, FRACTURES): Vit D, 25-Hydroxy: 30.74 ng/mL (ref 30–100)

## 2023-12-15 NOTE — Progress Notes (Signed)
 Vit D orders only

## 2023-12-15 NOTE — Patient Instructions (Signed)
 Alendronate Weekly Tablets What is this medication? ALENDRONATE (a LEN droe nate) treats osteoporosis. It works by Interior and spatial designer stronger and less likely to break (fracture). It belongs to a group of medications called bisphosphonates. This medicine may be used for other purposes; ask your health care provider or pharmacist if you have questions. COMMON BRAND NAME(S): Fosamax What should I tell my care team before I take this medication? They need to know if you have any of these conditions: Bleeding disorder Cancer Dental disease Difficulty swallowing Infection (fever, chills, cough, sore throat, pain or trouble passing urine) Kidney disease Low levels of calcium or other minerals in the blood Low red blood cell counts Receiving steroids like dexamethasone or prednisone Stomach or intestine problems Trouble sitting or standing for 30 minutes An unusual or allergic reaction to alendronate, other medications, foods, dyes or preservatives Pregnant or trying to get pregnant Breast-feeding How should I use this medication? Take this medication by mouth with a full glass of water. Take it as directed on the prescription label at the same day of each week. Take the dose right after waking up. Do not eat or drink anything before taking it. Do not take it with any other drink except water. Do not chew or crush the tablet. After taking it, do not eat breakfast, drink, or take any other medications or vitamins for at least 30 minutes. Sit or stand up for at least 30 minutes after you take it. Do not lie down. Keep taking it unless your care team tells you to stop. A special MedGuide will be given to you by the pharmacist with each prescription and refill. Be sure to read this information carefully each time. Talk to your care team about the use of this medication in children. Special care may be needed. Overdosage: If you think you have taken too much of this medicine contact a poison control  center or emergency room at once. NOTE: This medicine is only for you. Do not share this medicine with others. What if I miss a dose? If you take your medication once a day, skip it. Take your next dose at the scheduled time the next morning. Do not take two doses on the same day. If you take your medication once a week, take the missed dose on the morning after you remember. Do not take two doses on the same day. What may interact with this medication? Aluminum hydroxide Antacids Aspirin Calcium supplements Iron supplements Medications for inflammation like ibuprofen, naproxen, and others Magnesium supplements Vitamins with minerals This list may not describe all possible interactions. Give your health care provider a list of all the medicines, herbs, non-prescription drugs, or dietary supplements you use. Also tell them if you smoke, drink alcohol, or use illegal drugs. Some items may interact with your medicine. What should I watch for while using this medication? Visit your care team for regular checks on your progress. It may be some time before you see the benefit from this medication. Some people who take this medication have severe bone, joint, or muscle pain. This medication may also increase your risk for jaw problems or a broken thigh bone. Tell your care team right away if you have severe pain in your jaw, bones, joints, or muscles. Tell you care team if you have any pain that does not go away or that gets worse. Tell your dentist and dental surgeon that you are taking this medication. You should not have major dental surgery while on  this medication. See your dentist to have a dental exam and fix any dental problems before starting this medication. Take good care of your teeth while on this medication. Make sure you see your dentist for regular follow-up appointments. You should make sure you get enough calcium and vitamin D while you are taking this medication. Discuss the foods you  eat and the vitamins you take with your care team. You may need blood work done while you are taking this medication. What side effects may I notice from receiving this medication? Side effects that you should report to your care team as soon as possible: Allergic reactions--skin rash, itching, hives, swelling of the face, lips, tongue, or throat Low calcium level--muscle pain or cramps, confusion, tingling, or numbness in the hands or feet Osteonecrosis of the jaw--pain, swelling, or redness in the mouth, numbness of the jaw, poor healing after dental work, unusual discharge from the mouth, visible bones in the mouth Pain or trouble swallowing Severe bone, joint, or muscle pain Stomach bleeding--bloody or black, tar-like stools, vomiting blood or brown material that looks like coffee grounds Side effects that usually do not require medical attention (report to your care team if they continue or are bothersome): Constipation Diarrhea Nausea Stomach pain This list may not describe all possible side effects. Call your doctor for medical advice about side effects. You may report side effects to FDA at 1-800-FDA-1088. Where should I keep my medication? Keep out of the reach of children and pets. Store at room temperature between 15 and 30 degrees C (59 and 86 degrees F). Throw away any unused medication after the expiration date. NOTE: This sheet is a summary. It may not cover all possible information. If you have questions about this medicine, talk to your doctor, pharmacist, or health care provider.  2024 Elsevier/Gold Standard (2020-09-04 00:00:00)

## 2023-12-15 NOTE — Progress Notes (Signed)
 SURVIVORSHIP VISIT:  BRIEF ONCOLOGIC HISTORY:  Oncology History  Malignant neoplasm of upper-outer quadrant of left breast in female, estrogen receptor positive (HCC)  05/27/2023 Initial Diagnosis   Screening mammogram detected left breast asymmetry 12 o'clock position measuring 1.7 cm by ultrasound.  Ultrasound-guided biopsy revealed grade 3 IDC no LVI, ER 100%, PR 30%, Ki67 40%, HER2 negative   06/04/2023 Cancer Staging   Staging form: Breast, AJCC 8th Edition - Clinical: Stage IA (cT1c, cN0, cM0, G3, ER+, PR+, HER2-) - Signed by [name], MD on 06/04/2023 Stage prefix: Initial diagnosis Histologic grading system: 3 grade system   07/02/2023 Surgery   Left lumpectomy: Grade 3 IDC 1.4 cm with intermediate grade DCIS, margins negative, LVI not identified, ER 100%, PR 30%, HER2 -0, Ki-67 40% 0/1 lymph node negative   08/24/2023 Oncotype testing   Oncotype DX score: 20 (distant recurrence at 9 years: 6%)   08/27/2023 - 09/16/2023 Radiation Therapy   Site: Breast, Left Technique: 3D Mode: Photon Dose Per Fraction: 2.67 Gy Prescribed Dose (Delivered / Prescribed): 42.72 Gy / 42.72 Gy Prescribed Fxs (Delivered / Prescribed): 16 / 16   09/2023 -  Anti-estrogen oral therapy   Anastrozole x 7 years     INTERVAL HISTORY:    Ms. Gayman to review her survivorship care plan detailing her treatment course for breast cancer, as well as monitoring long-term side effects of that treatment, education regarding health maintenance, screening, and overall wellness and health promotion.     Overall, Ms. Pollio reports feeling quite well.  She is taking Anastrozole daily with good tolerance.  She initially experienced hot flashes, however these have since resolved.  REVIEW OF SYSTEMS:  Review of Systems  Constitutional:  Negative for appetite change, chills, fatigue, fever and unexpected weight change.  HENT:   Negative for hearing loss, lump/mass and trouble swallowing.   Eyes:  Negative for  eye problems and icterus.  Respiratory:  Negative for chest tightness, cough and shortness of breath.   Cardiovascular:  Negative for chest pain, leg swelling and palpitations.  Gastrointestinal:  Negative for abdominal distention, abdominal pain, constipation, diarrhea, nausea and vomiting.  Endocrine: Negative for hot flashes.  Genitourinary:  Negative for difficulty urinating.   Musculoskeletal:  Negative for arthralgias.  Skin:  Negative for itching and rash.  Neurological:  Negative for dizziness, extremity weakness, headaches and numbness.  Hematological:  Negative for adenopathy. Does not bruise/bleed easily.  Psychiatric/Behavioral:  Negative for depression. The patient is not nervous/anxious.   Breast: Denies any new nodularity, masses, tenderness, nipple changes, or nipple discharge.       PAST MEDICAL/SURGICAL HISTORY:  Past Medical History:  Diagnosis Date   Allergy    seasonal allergies   Benign colon polyp 10/25/2020   07/2020, Dr. Leone Payor; q 10 yr recall   Esophageal reflux    hx of-with certain foods   Frequent UTI    Heart murmur    current   History of radiation therapy    Left breast- 08/26/23-09/16/23-Dr. Antony Blackbird   Past Surgical History:  Procedure Laterality Date   BREAST BIOPSY Left 05/27/2023   [place] LT BREAST BX W LOC DEV 1ST LESION IMG BX SPEC US GUIDE 05/27/2023 [facility] MAMMOGRAPHY   BREAST BIOPSY Left 07/01/2023   [place] LT RADIOACTIVE SEED LOC 07/01/2023 [facility] ULTRASOUND   BREAST LUMPECTOMY WITH RADIOACTIVE SEED AND SENTINEL LYMPH NODE BIOPSY Left 07/02/2023   Procedure: LEFT BREAST SEED LOCALIZED LUMPECTOMY WITH SENTINEL NODE BIOPSY;  Surgeon: Emelia Loron, MD;  Location:  [facility];  Service: General;  Laterality: Left;  LMA PEC BLOCK   CHOLECYSTECTOMY     2008   DILATION AND CURETTAGE OF UTERUS     WISDOM TOOTH EXTRACTION     ALLERGIES:  No Known Allergies  CURRENT MEDICATIONS:  Outpatient Encounter Medications as of  12/15/2023  Medication Sig   ALPRAZolam (XANAX) 0.5 MG tablet Take 1 tablet (0.5 mg total) by mouth at bedtime as needed for sleep.   anastrozole (ARIMIDEX) 1 MG tablet Take 1 tablet (1 mg total) by mouth daily.   triamcinolone ointment (KENALOG) 0.5 % Apply 1 Application topically 2 (two) times daily.   No facility-administered encounter medications on file as of 12/15/2023.   ONCOLOGIC FAMILY HISTORY:  Family History  Problem Relation Age of Onset   Arthritis Mother    Hypertension Mother    Diabetes Mother    Hypertension Father    Healthy Sister    Pancreatic cancer Maternal Uncle        dx >50   Lung cancer Maternal Uncle        dx >50   Diabetes Maternal Grandmother    Kidney disease Maternal Grandmother    Heart disease Maternal Grandfather    Dementia Paternal Grandmother    Healthy Son    Colon polyps Neg Hx    Colon cancer Neg Hx    Esophageal cancer Neg Hx    Rectal cancer Neg Hx    Stomach cancer Neg Hx    SOCIAL HISTORY:  Social History   Socioeconomic History   Marital status: Married    Spouse name: Not on file   Number of children: 1   Years of education: Not on file   Highest education level: Not on file  Occupational History   Occupation: owns hair salon/hair stylist    Employer: [employer]  Tobacco Use   Smoking status: Never   Smokeless tobacco: Never  Vaping Use   Vaping status: Never Used  Substance and Sexual Activity   Alcohol use: Yes    Alcohol/week: 2.0 standard drinks of alcohol    Types: 2 Standard drinks or equivalent per week    Comment: occas   Drug use: No   Sexual activity: Yes    Birth control/protection: Post-menopausal  Other Topics Concern   Not on file  Social History Narrative   Not on file   Social Drivers of Health   Financial Resource Strain: Not on file  Food Insecurity: No Food Insecurity (07/31/2023)   Hunger Vital Sign    Worried About Running Out of Food in the Last Year: Never true    Ran Out of Food  in the Last Year: Never true  Transportation Needs: No Transportation Needs (07/31/2023)   PRAPARE - Administrator, Civil Service (Medical): No    Lack of Transportation (Non-Medical): No  Physical Activity: Not on file  Stress: Not on file  Social Connections: Not on file  Intimate Partner Violence: Not At Risk (07/31/2023)   Humiliation, Afraid, Rape, and Kick questionnaire    Fear of Current or Ex-Partner: No    Emotionally Abused: No    Physically Abused: No    Sexually Abused: No   OBSERVATIONS/OBJECTIVE:  BP 128/74 (BP Location: Right Arm, Patient Position: Sitting)   Pulse 78   Temp 97.8 F (36.6 C) (Tympanic)   Resp 18   Ht 5\' 7"  (1.702 m)   Wt 137 lb 12.8 oz (62.5 kg)  LMP 04/24/2015   SpO2 100%   BMI 21.58 kg/m  GENERAL: Patient is a well appearing female in no acute distress HEENT:  Sclerae anicteric.  Oropharynx clear and moist. No ulcerations or evidence of oropharyngeal candidiasis. Neck is supple.  NODES:  No cervical, supraclavicular, or axillary lymphadenopathy palpated.  BREAST EXAM:  left breast s/p lumpectomy and radiation, no sign of local recurrence LUNGS:  Clear to auscultation bilaterally.  No wheezes or rhonchi. HEART:  Regular rate and rhythm. No murmur appreciated. ABDOMEN:  Soft, nontender.  Positive, normoactive bowel sounds. No organomegaly palpated. MSK:  No focal spinal tenderness to palpation. Full range of motion bilaterally in the upper extremities. EXTREMITIES:  No peripheral edema.   SKIN:  Clear with no obvious rashes or skin changes. No nail dyscrasia. NEURO:  Nonfocal. Well oriented.  Appropriate affect.  LABORATORY DATA:  None for this visit.  DIAGNOSTIC IMAGING:  None for this visit.   ASSESSMENT AND PLAN:  Ms.. Yam is a pleasant 58 y.o. female with Stage IA left breast invasive ductal carcinoma, ER+/PR+/HER2-, diagnosed in 05/2023, treated with lumpectomy, adjuvant radiation therapy, and anti-estrogen therapy with  Anastrozole beginning in 09/2023.  She presents to the Survivorship Clinic for our initial meeting and routine follow-up post-completion of treatment for breast cancer.    1. Stage IA left breast cancer:  Ms. Pritz is continuing to recover from definitive treatment for breast cancer. She will follow-up with her medical oncologist, Dr.  Pamelia Hoit in 6 months with history and physical exam per surveillance protocol.  She will continue her anti-estrogen therapy with Anastrozole. Thus far, she is tolerating the Anastrozole well, with minimal side effects. Her mammogram is due 04/2024; orders placed today.   Today, a comprehensive survivorship care plan and treatment summary was reviewed with the patient today detailing her breast cancer diagnosis, treatment course, potential late/long-term effects of treatment, appropriate follow-up care with recommendations for the future, and patient education resources.  A copy of this summary, along with a letter will be sent to the patient's primary care provider via mail/fax/In Basket message after today's visit.    2. Bone health:  Given Ms. Greif's age/history of breast cancer and her current treatment regimen including anti-estrogen therapy with Anastrozole, she is at risk for bone demineralization.  Her last DEXA scan was 09/2023, which showed osteoporosis with a t score of -3.1 in the ap spine.  She was given information on Osteostrong and Fosamax.  She is going to read about fosamax more and will let me know if she opts to start it and we will send in prescription. She was given education on specific activities to promote bone health.  3. Cancer screening:  Due to Ms. Forstrom's history and her age, she should receive screening for skin cancers, colon cancer, and gynecologic cancers.  The information and recommendations are listed on the patient's comprehensive care plan/treatment summary and were reviewed in detail with the patient.    4. Health maintenance and  wellness promotion: Ms. Lore was encouraged to consume 5-7 servings of fruits and vegetables per day. We reviewed the "Nutrition Rainbow" handout.  She was also encouraged to engage in moderate to vigorous exercise for 30 minutes per day most days of the week.  She was instructed to limit her alcohol consumption and continue to abstain from tobacco use.     5. Support services/counseling: It is not uncommon for this period of the patient's cancer care trajectory to be one of many emotions and stressors.  She was given information regarding our available services and encouraged to contact me with any questions or for help enrolling in any of our support group/programs.    Follow up instructions:    -Return to cancer center in 6 months for f/u with Dr. Pamelia Hoit  -Mammogram due in 04/2024 -She is welcome to return back to the Survivorship Clinic at any time; no additional follow-up needed at this time.  -Consider referral back to survivorship as a long-term survivor for continued surveillance  The patient was provided an opportunity to ask questions and all were answered. The patient agreed with the plan and demonstrated an understanding of the instructions.   Total encounter time:45 minutes*in face-to-face visit time, chart review, lab review, care coordination, order entry, and documentation of the encounter time.    Lillard Anes, NP 12/15/23 10:11 AM Medical Oncology and Hematology [facility] [address] [place], [place] [address] Tel. [phone]    Fax. [phone]  *Total Encounter Time as defined by the Centers for Medicare and Medicaid Services includes, in addition to the face-to-face time of a patient visit (documented in the note above) non-face-to-face time: obtaining and reviewing outside history, ordering and reviewing medications, tests or procedures, care coordination (communications with other health care professionals or caregivers) and documentation in  the medical record.

## 2023-12-16 ENCOUNTER — Telehealth: Payer: Self-pay | Admitting: Adult Health

## 2023-12-16 NOTE — Telephone Encounter (Signed)
 Scheduled appointment per 3/24 los. Talked with the patient and she is aware of the made appointment.

## 2024-01-05 ENCOUNTER — Ambulatory Visit: Payer: BC Managed Care – PPO

## 2024-01-08 ENCOUNTER — Ambulatory Visit (INDEPENDENT_AMBULATORY_CARE_PROVIDER_SITE_OTHER): Admitting: *Deleted

## 2024-01-08 DIAGNOSIS — Z23 Encounter for immunization: Secondary | ICD-10-CM

## 2024-01-08 NOTE — Progress Notes (Signed)
 Patient present for #1 shingles vaccine  Given on IM RT deltoid  Patient tolerated well

## 2024-01-22 ENCOUNTER — Ambulatory Visit: Attending: General Surgery | Admitting: Physical Therapy

## 2024-01-22 ENCOUNTER — Encounter: Payer: Self-pay | Admitting: Physical Therapy

## 2024-01-22 DIAGNOSIS — Z483 Aftercare following surgery for neoplasm: Secondary | ICD-10-CM | POA: Insufficient documentation

## 2024-01-22 NOTE — Therapy (Signed)
 OUTPATIENT PHYSICAL THERAPY SOZO SCREENING NOTE   Patient Name: [name] MRN: [id number] DOB:07-18-1966, 58 y.o., female Today's Date: 01/22/2024  PCP: Luevenia Saha, MD REFERRING PROVIDER: Lockie Rima, MD   PT End of Session - 01/22/24 1129     Visit Number 3    PT Start Time 1105    PT Stop Time 1115    PT Time Calculation (min) 10 min    Activity Tolerance Patient tolerated treatment well    Behavior During Therapy [facility] for tasks assessed/performed             Past Medical History:  Diagnosis Date   Allergy    seasonal allergies   Benign colon polyp 10/25/2020   07/2020, Dr. Willy Harvest; q 10 yr recall   Esophageal reflux    hx of-with certain foods   Frequent UTI    Heart murmur    current   History of radiation therapy    Left breast- 08/26/23-09/16/23-Dr. Retta Caster   Past Surgical History:  Procedure Laterality Date   BREAST BIOPSY Left 05/27/2023   US  LT BREAST BX W LOC DEV 1ST LESION IMG BX SPEC US  GUIDE 05/27/2023 [facility]AMMOGRAPHY   BREAST BIOPSY Left 07/01/2023   US  LT RADIOACTIVE SEED LOC 07/01/2023 [facility] ULTRASOUND   BREAST LUMPECTOMY WITH RADIOACTIVE SEED AND SENTINEL LYMPH NODE BIOPSY Left 07/02/2023   Procedure: LEFT BREAST SEED LOCALIZED LUMPECTOMY WITH SENTINEL NODE BIOPSY;  Surgeon: Enid Harry, MD;  Location: [facility];  Service: General;  Laterality: Left;  LMA PEC BLOCK   CHOLECYSTECTOMY     2008   DILATION AND CURETTAGE OF UTERUS     WISDOM TOOTH EXTRACTION     Patient Active Problem List   Diagnosis Date Noted   Other osteoporosis without current pathological fracture 12/01/2023   Malignant neoplasm of upper-outer quadrant of left breast in female, estrogen receptor positive (HCC) 06/02/2023   Benign colon polyp 10/25/2020   Secondary insomnia 10/25/2020   Multinodular goiter 10/12/2020   GERD (gastroesophageal reflux disease) 07/28/2008    REFERRING DIAG: left breast cancer at risk for  lymphedema  THERAPY DIAG:  Aftercare following surgery for neoplasm  PERTINENT HISTORY: Patient was diagnosed on 05/28/2023 with left grade III invasive ductal carcinoma breast cancer. She underwent a left lumpectomy and sentinel node biopsy (1 negative node) on 07/02/2023. It is ER/PR positive and HER2 negative with a Ki67 of 40%.   PRECAUTIONS: left UE Lymphedema risk  SUBJECTIVE: Here for SOZO screen  PAIN:  Are you having pain? No  SOZO SCREENING: Patient was assessed today using the SOZO machine to determine the lymphedema index score. This was compared to her baseline score. It was determined that she is within the recommended range when compared to her baseline and no further action is needed at this time. She will continue SOZO screenings. These are done every 3 months for 2 years post operatively followed by every 6 months for 2 years, and then annually.   L-DEX FLOWSHEETS - 01/22/24 1100       L-DEX LYMPHEDEMA SCREENING   Measurement Type Unilateral    L-DEX MEASUREMENT EXTREMITY Upper Extremity    POSITION  Standing    DOMINANT SIDE Right    At Risk Side Left    BASELINE SCORE (UNILATERAL) -0.9    L-DEX SCORE (UNILATERAL) -2.3    VALUE CHANGE (UNILAT) -1.4            Rollin Clock, [place] 01/22/24 11:31 AM

## 2024-02-02 DIAGNOSIS — L821 Other seborrheic keratosis: Secondary | ICD-10-CM | POA: Diagnosis not present

## 2024-02-02 DIAGNOSIS — L570 Actinic keratosis: Secondary | ICD-10-CM | POA: Diagnosis not present

## 2024-02-02 DIAGNOSIS — Z85828 Personal history of other malignant neoplasm of skin: Secondary | ICD-10-CM | POA: Diagnosis not present

## 2024-04-08 ENCOUNTER — Ambulatory Visit

## 2024-04-23 ENCOUNTER — Other Ambulatory Visit: Payer: Self-pay | Admitting: Family Medicine

## 2024-04-23 MED ORDER — ALPRAZOLAM 0.5 MG PO TABS: 0.5000 mg | ORAL_TABLET | Freq: Every evening | ORAL | 0 refills | Status: AC | PRN

## 2024-04-23 NOTE — Telephone Encounter (Signed)
 Copied from CRM [id number]. Topic: Clinical - Medication Refill >> Apr 23, 2024  9:41 AM Macario HERO wrote: Medication: ALPRAZolam  (XANAX ) 0.5 MG tablet [[id number]]  Has the patient contacted their pharmacy? No (Agent: If no, request that the patient contact the pharmacy for the refill. If patient does not wish to contact the pharmacy document the reason why and proceed with request.) (Agent: If yes, when and what did the pharmacy advise?)  This is the patient's preferred pharmacy:  [facility],  - [address] [place] [address] Phone: [phone] Fax: [phone]  Is this the correct pharmacy for this prescription? Yes If no, delete pharmacy and type the correct one.   Has the prescription been filled recently? Yes  Is the patient out of the medication? Yes  Has the patient been seen for an appointment in the last year OR does the patient have an upcoming appointment? Yes  Can we respond through MyChart? Yes  Agent: Please be advised that Rx refills may take up to 3 business days. We ask that you follow-up with your pharmacy.

## 2024-04-23 NOTE — Telephone Encounter (Signed)
 12/01/2023 lov  12/01/2023 fill date  30/0 refills

## 2024-05-03 ENCOUNTER — Ambulatory Visit: Attending: General Surgery

## 2024-05-03 VITALS — Wt 136.4 lb

## 2024-05-03 DIAGNOSIS — Z483 Aftercare following surgery for neoplasm: Secondary | ICD-10-CM | POA: Insufficient documentation

## 2024-05-03 NOTE — Therapy (Signed)
 OUTPATIENT PHYSICAL THERAPY SOZO SCREENING NOTE   Patient Name: [name] MRN: [id number] DOB:[birth year], 58 y.o., female Today's Date: 05/03/2024  PCP: Jodie Lavern CROME, MD REFERRING PROVIDER: Aron Shoulders, MD   PT End of Session - 05/03/24 1535     Visit Number 3   # unchanged due to screen only   PT Start Time 1533    PT Stop Time 1537    PT Time Calculation (min) 4 min    Activity Tolerance Patient tolerated treatment well    Behavior During Therapy [facility] for tasks assessed/performed          Past Medical History:  Diagnosis Date   Allergy    seasonal allergies   Benign colon polyp 10/25/2020   07/2020, Dr. Avram; q 10 yr recall   Esophageal reflux    hx of-with certain foods   Frequent UTI    Heart murmur    current   History of radiation therapy    Left breast- 08/26/23-09/16/23-Dr. Lynwood Nasuti   Past Surgical History:  Procedure Laterality Date   BREAST BIOPSY Left 05/27/2023   US  LT BREAST BX W LOC DEV 1ST LESION IMG BX SPEC US  GUIDE 05/27/2023 [facility] MAMMOGRAPHY   BREAST BIOPSY Left 07/01/2023   US  LT RADIOACTIVE SEED LOC 07/01/2023 [facility] ULTRASOUND   BREAST LUMPECTOMY WITH RADIOACTIVE SEED AND SENTINEL LYMPH NODE BIOPSY Left 07/02/2023   Procedure: LEFT BREAST SEED LOCALIZED LUMPECTOMY WITH SENTINEL NODE BIOPSY;  Surgeon: Ebbie Cough, MD;  Location: [facility];  Service: General;  Laterality: Left;  LMA PEC BLOCK   CHOLECYSTECTOMY     2008   DILATION AND CURETTAGE OF UTERUS     WISDOM TOOTH EXTRACTION     Patient Active Problem List   Diagnosis Date Noted   Other osteoporosis without current pathological fracture 12/01/2023   Malignant neoplasm of upper-outer quadrant of left breast in female, estrogen receptor positive (HCC) 06/02/2023   Benign colon polyp 10/25/2020   Secondary insomnia 10/25/2020   Multinodular goiter 10/12/2020   GERD (gastroesophageal reflux disease) 07/28/2008    REFERRING DIAG: left breast cancer at  risk for lymphedema  THERAPY DIAG:  Aftercare following surgery for neoplasm  PERTINENT HISTORY: Patient was diagnosed on 05/28/2023 with left grade III invasive ductal carcinoma breast cancer. She underwent a left lumpectomy and sentinel node biopsy (1 negative node) on 07/02/2023. It is ER/PR positive and HER2 negative with a Ki67 of 40%.   PRECAUTIONS: left UE Lymphedema risk  SUBJECTIVE: Here for SOZO screen  PAIN:  Are you having pain? No  SOZO SCREENING: Patient was assessed today using the SOZO machine to determine the lymphedema index score. This was compared to her baseline score. It was determined that she is within the recommended range when compared to her baseline and no further action is needed at this time. She will continue SOZO screenings. These are done every 3 months for 2 years post operatively followed by every 6 months for 2 years, and then annually.   L-DEX FLOWSHEETS - 05/03/24 1500       L-DEX LYMPHEDEMA SCREENING   Measurement Type Unilateral    L-DEX MEASUREMENT EXTREMITY Upper Extremity    POSITION  Standing    DOMINANT SIDE Right    At Risk Side Left    BASELINE SCORE (UNILATERAL) -0.9    L-DEX SCORE (UNILATERAL) -0.8    VALUE CHANGE (UNILAT) 0.1         Berwyn Knights, PTA 05/03/24 3:36 PM

## 2024-05-17 ENCOUNTER — Ambulatory Visit
Admission: RE | Admit: 2024-05-17 | Discharge: 2024-05-17 | Disposition: A | Discharge status: Home / Self Care | Source: Ambulatory Visit | Attending: Adult Health | Admitting: Adult Health

## 2024-05-17 ENCOUNTER — Other Ambulatory Visit: Payer: Self-pay | Admitting: Adult Health

## 2024-05-17 DIAGNOSIS — N63 Unspecified lump in unspecified breast: Secondary | ICD-10-CM

## 2024-05-17 DIAGNOSIS — Z170 Estrogen receptor positive status [ER+]: Secondary | ICD-10-CM

## 2024-05-17 DIAGNOSIS — R928 Other abnormal and inconclusive findings on diagnostic imaging of breast: Secondary | ICD-10-CM | POA: Diagnosis not present

## 2024-05-17 DIAGNOSIS — N6321 Unspecified lump in the left breast, upper outer quadrant: Secondary | ICD-10-CM | POA: Diagnosis not present

## 2024-05-17 HISTORY — DX: Personal history of irradiation: Z92.3

## 2024-06-14 DIAGNOSIS — M5459 Other low back pain: Secondary | ICD-10-CM | POA: Diagnosis not present

## 2024-06-16 ENCOUNTER — Inpatient Hospital Stay: Attending: Hematology and Oncology | Admitting: Hematology and Oncology

## 2024-06-16 VITALS — BP 149/82 | HR 70 | Temp 98.0°F | Resp 18 | Ht 67.0 in | Wt 137.6 lb

## 2024-06-16 DIAGNOSIS — C50412 Malignant neoplasm of upper-outer quadrant of left female breast: Secondary | ICD-10-CM | POA: Diagnosis not present

## 2024-06-16 DIAGNOSIS — Z1732 Human epidermal growth factor receptor 2 negative status: Secondary | ICD-10-CM | POA: Diagnosis not present

## 2024-06-16 DIAGNOSIS — N6489 Other specified disorders of breast: Secondary | ICD-10-CM | POA: Diagnosis not present

## 2024-06-16 DIAGNOSIS — Z79811 Long term (current) use of aromatase inhibitors: Secondary | ICD-10-CM | POA: Diagnosis not present

## 2024-06-16 DIAGNOSIS — Z1721 Progesterone receptor positive status: Secondary | ICD-10-CM | POA: Diagnosis not present

## 2024-06-16 DIAGNOSIS — Z923 Personal history of irradiation: Secondary | ICD-10-CM | POA: Diagnosis not present

## 2024-06-16 DIAGNOSIS — Z170 Estrogen receptor positive status [ER+]: Secondary | ICD-10-CM | POA: Insufficient documentation

## 2024-06-16 NOTE — Assessment & Plan Note (Signed)
 05/27/2023:Screening mammogram detected left breast asymmetry 12 o'clock position measuring 1.7 cm by ultrasound.  Ultrasound-guided biopsy revealed grade 3 IDC no LVI, ER 100%, PR 30%, Ki67 40%, HER2 negative    07/02/2023: Left lumpectomy: Grade 3 IDC 1.4 cm with intermediate grade DCIS, margins negative, LVI not identified, ER 100%, PR 30%, HER2 -0, Ki-67 40% 0/1 lymph node negative Oncotype DX score: 20 (distant recurrence at 9 years: 6%)   Treatment plan: Adjuvant radiation therapy 08/27/2023-09/16/2023 Adjuvant antiestrogen therapy   Her son is an avid golfer.  Her husband also enjoys golf. She works as a Interior and spatial designer.   Anastrozole  toxicities:  Breast cancer surveillance: Breast exam 06/16/2024: Benign Mammogram and ultrasound 05/17/2024: Seroma left breast 3.7 cm, benign breast density category C  Return to clinic in 1 year for follow-up

## 2024-06-16 NOTE — Progress Notes (Signed)
 Patient Care Team: Jodie Lavern CROME, MD as PCP - General (Family Medicine) Austin Ned, MD as Consulting Physician (Obstetrics and Gynecology) Avram Lupita BRAVO, MD as Consulting Physician (Gastroenterology) Caresse Cough, MD as Consulting Physician (Ophthalmology) Benay Kay, PA-C as Physician Assistant (Otolaryngology) Aron Shoulders, MD as Consulting Physician (General Surgery) Odean Potts, MD as Consulting Physician (Hematology and Oncology) Shannon Agent, MD as Consulting Physician (Radiation Oncology) Crawford Morna Pickle, NP as Nurse Practitioner (Hematology and Oncology)  DIAGNOSIS:  Encounter Diagnosis  Name Primary?   Malignant neoplasm of upper-outer quadrant of left breast in female, estrogen receptor positive (HCC) Yes    SUMMARY OF ONCOLOGIC HISTORY: Oncology History  Malignant neoplasm of upper-outer quadrant of left breast in female, estrogen receptor positive (HCC)  05/27/2023 Initial Diagnosis   Screening mammogram detected left breast asymmetry 12 o'clock position measuring 1.7 cm by ultrasound.  Ultrasound-guided biopsy revealed grade 3 IDC no LVI, ER 100%, PR 30%, Ki67 40%, HER2 negative   06/04/2023 Cancer Staging   Staging form: Breast, AJCC 8th Edition - Clinical: Stage IA (cT1c, cN0, cM0, G3, ER+, PR+, HER2-) - Signed by Odean Potts, MD on 06/04/2023 Stage prefix: Initial diagnosis Histologic grading system: 3 grade system   07/02/2023 Surgery   Left lumpectomy: Grade 3 IDC 1.4 cm with intermediate grade DCIS, margins negative, LVI not identified, ER 100%, PR 30%, HER2 -0, Ki-67 40% 0/1 lymph node negative   08/24/2023 Oncotype testing   Oncotype DX score: 20 (distant recurrence at 9 years: 6%)   08/27/2023 - 09/16/2023 Radiation Therapy   Site: Breast, Left Technique: 3D Mode: Photon Dose Per Fraction: 2.67 Gy Prescribed Dose (Delivered / Prescribed): 42.72 Gy / 42.72 Gy Prescribed Fxs (Delivered / Prescribed): 16 / 16   09/2023 -   Anti-estrogen oral therapy   Anastrozole  x 7 years     CHIEF COMPLIANT: Surveillance of breast cancer on anastrozole  therapy  HISTORY OF PRESENT ILLNESS:   History of Present Illness [name] is a 58 year old female with breast cancer who presents for follow-up regarding her current treatment and surveillance.  She is currently on anastrozole  and experiences mood changes and joint pain, which she attributes to the medication. She also has more frequent hot flashes at night. A mammogram on May 16, 2024, was followed by an ultrasound due to a palpable seroma. She is concerned about discomfort during mammograms due to soreness from the seroma and notes that her breasts are dense, which affects imaging. She is learning about circulating tumor DNA testing as a potential surveillance tool.     ALLERGIES:  has no known allergies.  MEDICATIONS:  Current Outpatient Medications  Medication Sig Dispense Refill   ALPRAZolam  (XANAX ) 0.5 MG tablet Take 1 tablet (0.5 mg total) by mouth at bedtime as needed for sleep. 30 tablet 0   anastrozole  (ARIMIDEX ) 1 MG tablet Take 1 tablet (1 mg total) by mouth daily. 90 tablet 3   No current facility-administered medications for this visit.    PHYSICAL EXAMINATION: ECOG PERFORMANCE STATUS: 1 - Symptomatic but completely ambulatory  Vitals:   06/16/24 1041  BP: (!) 149/82  Pulse: 70  Resp: 18  Temp: 98 F (36.7 C)  SpO2: 100%   Filed Weights   06/16/24 1041  Weight: 137 lb 9.6 oz (62.4 kg)    Physical Exam No lumps or nodules in bilateral breasts or axilla  (exam performed in the presence of a chaperone)  LABORATORY DATA:  I have reviewed the data as  listed    Latest Ref Rng & Units 12/01/2023   11:38 AM 06/04/2023   12:28 PM 07/04/2021   11:20 AM  CMP  Glucose 70 - 99 mg/dL 72  93  70   BUN 6 - 23 mg/dL 12  9  9    Creatinine 0.40 - 1.20 mg/dL 9.43  9.35  9.38   Sodium 135 - 145 mEq/L 142  142  140   Potassium 3.5 - 5.1  mEq/L 3.7  4.0  4.0   Chloride 96 - 112 mEq/L 102  106  103   CO2 19 - 32 mEq/L 32  31  31   Calcium 8.4 - 10.5 mg/dL 9.7  9.9  9.8   Total Protein 6.0 - 8.3 g/dL 7.1  7.7  7.5   Total Bilirubin 0.2 - 1.2 mg/dL 0.4  0.5  0.5   Alkaline Phos 39 - 117 U/L 78  89  88   AST 0 - 37 U/L 23  16  17    ALT 0 - 35 U/L 32  18  17     Lab Results  Component Value Date   WBC 4.6 12/01/2023   HGB 13.9 12/01/2023   HCT 41.7 12/01/2023   MCV 90.8 12/01/2023   PLT 272.0 12/01/2023   NEUTROABS 2.5 12/01/2023    ASSESSMENT & PLAN:  Malignant neoplasm of upper-outer quadrant of left breast in female, estrogen receptor positive (HCC) 05/27/2023:Screening mammogram detected left breast asymmetry 12 o'clock position measuring 1.7 cm by ultrasound.  Ultrasound-guided biopsy revealed grade 3 IDC no LVI, ER 100%, PR 30%, Ki67 40%, HER2 negative    07/02/2023: Left lumpectomy: Grade 3 IDC 1.4 cm with intermediate grade DCIS, margins negative, LVI not identified, ER 100%, PR 30%, HER2 -0, Ki-67 40% 0/1 lymph node negative Oncotype DX score: 20 (distant recurrence at 9 years: 6%)   Treatment plan: Adjuvant radiation therapy 08/27/2023-09/16/2023 Adjuvant antiestrogen therapy with anastrozole  1 mg daily started January 2025   Her son is an avid Teacher, English as a foreign language.  Her husband also enjoys golf. She works as a Interior and spatial designer.   Anastrozole  toxicities: Mild intermittent hot flashes Mild to moderate joint discomfort  Overall she is tolerating the treatment fairly well  Breast cancer surveillance: Breast exam 06/16/2024: Benign Mammogram and ultrasound 05/17/2024: Seroma left breast 3.7 cm, benign breast density category C Guardant reveal for MRD testing  Seroma: Patient is interested in getting it drained.  I sent a message to Dr. Ebbie Return to clinic in 1 year for follow-up ------------------------------------- Assessment and Plan Assessment & Plan Estrogen receptor positive malignant neoplasm of upper-outer  quadrant of left breast Estrogen receptor positive breast cancer managed with anastrozole . Recent imaging showed dense breast tissue without concerning findings. Discussed circulating tumor DNA testing for early recurrence detection. - Continue anastrozole  1 mg oral daily. - Provide information on circulating tumor DNA testing (Gardant Reveal). - Consider circulating tumor DNA testing every six months.  Benign seroma of left breast Persistent benign seroma causing soreness, not concerning for malignancy. - Monitor seroma for changes or resolution.      No orders of the defined types were placed in this encounter.  The patient has a good understanding of the overall plan. she agrees with it. she will call with any problems that may develop before the next visit here. Total time spent: 30 mins including face to face time and time spent for planning, charting and co-ordination of care   Jessica MARLA Chad, MD 06/16/24

## 2024-06-24 DIAGNOSIS — C50412 Malignant neoplasm of upper-outer quadrant of left female breast: Secondary | ICD-10-CM | POA: Diagnosis not present

## 2024-06-24 DIAGNOSIS — Z170 Estrogen receptor positive status [ER+]: Secondary | ICD-10-CM | POA: Diagnosis not present

## 2024-06-25 ENCOUNTER — Telehealth: Payer: Self-pay

## 2024-06-25 NOTE — Telephone Encounter (Signed)
 Per md orders entered for Guardant Reveal and all supported documents faxed to [phone]. Faxed confirmation was received.

## 2024-06-30 DIAGNOSIS — M5459 Other low back pain: Secondary | ICD-10-CM | POA: Diagnosis not present

## 2024-07-28 ENCOUNTER — Encounter: Payer: Self-pay | Admitting: *Deleted

## 2024-08-02 ENCOUNTER — Ambulatory Visit: Attending: General Surgery

## 2024-08-02 VITALS — Wt 137.4 lb

## 2024-08-02 DIAGNOSIS — Z483 Aftercare following surgery for neoplasm: Secondary | ICD-10-CM | POA: Insufficient documentation

## 2024-08-02 NOTE — Therapy (Signed)
 OUTPATIENT PHYSICAL THERAPY SOZO SCREENING NOTE   Patient Name: [name] MRN: [id number] DOB:March 29, 1966, 58 y.o., female Today's Date: 08/02/2024  PCP: Jodie Lavern CROME, MD REFERRING PROVIDER: Aron Shoulders, MD   PT End of Session - 08/02/24 1542     Visit Number 3   # unchanged due to screen only   PT Start Time 1539    PT Stop Time 1543    PT Time Calculation (min) 4 min    Activity Tolerance Patient tolerated treatment well    Behavior During Therapy [facility] for tasks assessed/performed          Past Medical History:  Diagnosis Date   Allergy    seasonal allergies   Benign colon polyp 10/25/2020   07/2020, Dr. Avram; q 10 yr recall   Esophageal reflux    hx of-with certain foods   Frequent UTI    Heart murmur    current   History of radiation therapy    Left breast- 08/26/23-09/16/23-Dr. Lynwood Nasuti   Personal history of radiation therapy    Past Surgical History:  Procedure Laterality Date   BREAST BIOPSY Left 05/27/2023   US  LT BREAST BX W LOC DEV 1ST LESION IMG BX SPEC US  GUIDE 05/27/2023 [facility] MAMMOGRAPHY   BREAST BIOPSY Left 07/01/2023   US  LT RADIOACTIVE SEED LOC 07/01/2023 [facility] ULTRASOUND   BREAST LUMPECTOMY Left 07/02/2023   BREAST LUMPECTOMY WITH RADIOACTIVE SEED AND SENTINEL LYMPH NODE BIOPSY Left 07/02/2023   Procedure: LEFT BREAST SEED LOCALIZED LUMPECTOMY WITH SENTINEL NODE BIOPSY;  Surgeon: Ebbie Cough, MD;  Location: [facility];  Service: General;  Laterality: Left;  LMA PEC BLOCK   CHOLECYSTECTOMY     2008   DILATION AND CURETTAGE OF UTERUS     WISDOM TOOTH EXTRACTION     Patient Active Problem List   Diagnosis Date Noted   Other osteoporosis without current pathological fracture 12/01/2023   Malignant neoplasm of upper-outer quadrant of left breast in female, estrogen receptor positive (HCC) 06/02/2023   Benign colon polyp 10/25/2020   Secondary insomnia 10/25/2020   Multinodular goiter 10/12/2020   GERD  (gastroesophageal reflux disease) 07/28/2008    REFERRING DIAG: left breast cancer at risk for lymphedema  THERAPY DIAG:  Aftercare following surgery for neoplasm  PERTINENT HISTORY: Patient was diagnosed on 05/28/2023 with left grade III invasive ductal carcinoma breast cancer. She underwent a left lumpectomy and sentinel node biopsy (1 negative node) on 07/02/2023. It is ER/PR positive and HER2 negative with a Ki67 of 40%.   PRECAUTIONS: left UE Lymphedema risk  SUBJECTIVE: Here for SOZO screen  PAIN:  Are you having pain? No  SOZO SCREENING: Patient was assessed today using the SOZO machine to determine the lymphedema index score. This was compared to her baseline score. It was determined that she is within the recommended range when compared to her baseline and no further action is needed at this time. She will continue SOZO screenings. These are done every 3 months for 2 years post operatively followed by every 6 months for 2 years, and then annually.   L-DEX FLOWSHEETS - 08/02/24 1500       L-DEX LYMPHEDEMA SCREENING   Measurement Type Unilateral    L-DEX MEASUREMENT EXTREMITY Upper Extremity    POSITION  Standing    DOMINANT SIDE Right    At Risk Side Left    BASELINE SCORE (UNILATERAL) -0.9    L-DEX SCORE (UNILATERAL) 0.4    VALUE CHANGE (UNILAT) 1.3  Berwyn Knights, PTA 08/02/24 3:43 PM

## 2024-08-16 ENCOUNTER — Other Ambulatory Visit: Payer: Self-pay | Admitting: Hematology and Oncology

## 2024-11-01 ENCOUNTER — Ambulatory Visit: Attending: General Surgery

## 2024-12-02 ENCOUNTER — Encounter: Admitting: Family Medicine

## 2025-06-16 ENCOUNTER — Ambulatory Visit: Admitting: Hematology and Oncology
